# Patient Record
Sex: Female | Born: 1960 | Race: White | Hispanic: No | Marital: Married | Smoking: Never smoker
Health system: Southern US, Community
[De-identification: ages and names within clinical notes are randomized; demographics above are authoritative.]

## PROBLEM LIST (undated history)

## (undated) DIAGNOSIS — M1990 Unspecified osteoarthritis, unspecified site: Secondary | ICD-10-CM

## (undated) DIAGNOSIS — E079 Disorder of thyroid, unspecified: Secondary | ICD-10-CM

## (undated) DIAGNOSIS — I10 Essential (primary) hypertension: Secondary | ICD-10-CM

## (undated) DIAGNOSIS — R51 Headache: Secondary | ICD-10-CM

## (undated) DIAGNOSIS — R519 Headache, unspecified: Secondary | ICD-10-CM

## (undated) DIAGNOSIS — Z460 Encounter for fitting and adjustment of spectacles and contact lenses: Secondary | ICD-10-CM

## (undated) DIAGNOSIS — N200 Calculus of kidney: Secondary | ICD-10-CM

## (undated) DIAGNOSIS — Z780 Asymptomatic menopausal state: Secondary | ICD-10-CM

## (undated) DIAGNOSIS — R0981 Nasal congestion: Secondary | ICD-10-CM

## (undated) DIAGNOSIS — L309 Dermatitis, unspecified: Secondary | ICD-10-CM

## (undated) DIAGNOSIS — K219 Gastro-esophageal reflux disease without esophagitis: Secondary | ICD-10-CM

## (undated) DIAGNOSIS — R200 Anesthesia of skin: Secondary | ICD-10-CM

## (undated) DIAGNOSIS — J45909 Unspecified asthma, uncomplicated: Secondary | ICD-10-CM

## (undated) DIAGNOSIS — Z803 Family history of malignant neoplasm of breast: Secondary | ICD-10-CM

## (undated) DIAGNOSIS — F419 Anxiety disorder, unspecified: Secondary | ICD-10-CM

## (undated) DIAGNOSIS — T7840XA Allergy, unspecified, initial encounter: Secondary | ICD-10-CM

## (undated) HISTORY — DX: Dermatitis, unspecified: L30.9

## (undated) HISTORY — DX: Gastro-esophageal reflux disease without esophagitis: K21.9

## (undated) HISTORY — DX: Nasal congestion: R09.81

## (undated) HISTORY — DX: Calculus of kidney: N20.0

## (undated) HISTORY — DX: Disorder of thyroid, unspecified: E07.9

## (undated) HISTORY — DX: Essential (primary) hypertension: I10

## (undated) HISTORY — DX: Headache: R51

## (undated) HISTORY — DX: Unspecified osteoarthritis, unspecified site: M19.90

## (undated) HISTORY — DX: Encounter for fitting and adjustment of spectacles and contact lenses: Z46.0

## (undated) HISTORY — DX: Allergy, unspecified, initial encounter: T78.40XA

## (undated) HISTORY — DX: Family history of malignant neoplasm of breast: Z80.3

## (undated) HISTORY — DX: Headache, unspecified: R51.9

## (undated) HISTORY — DX: Asymptomatic menopausal state: Z78.0

## (undated) HISTORY — PX: BREAST EXCISIONAL BIOPSY: SUR124

## (undated) HISTORY — DX: Anxiety disorder, unspecified: F41.9

## (undated) HISTORY — DX: Anesthesia of skin: R20.0

---

## 1997-04-10 HISTORY — PX: OTHER SURGICAL HISTORY: SHX169

## 1997-06-16 ENCOUNTER — Ambulatory Visit (HOSPITAL_COMMUNITY): Admission: RE | Admit: 1997-06-16 | Discharge: 1997-06-16 | Payer: Self-pay | Admitting: Obstetrics and Gynecology

## 1997-07-27 ENCOUNTER — Ambulatory Visit (HOSPITAL_BASED_OUTPATIENT_CLINIC_OR_DEPARTMENT_OTHER): Admission: RE | Admit: 1997-07-27 | Discharge: 1997-07-27 | Payer: Self-pay | Admitting: Surgery

## 1999-02-08 ENCOUNTER — Other Ambulatory Visit: Admission: RE | Admit: 1999-02-08 | Discharge: 1999-02-08 | Payer: Self-pay | Admitting: Obstetrics and Gynecology

## 1999-08-22 ENCOUNTER — Other Ambulatory Visit: Admission: RE | Admit: 1999-08-22 | Discharge: 1999-08-22 | Payer: Self-pay | Admitting: Obstetrics and Gynecology

## 2000-03-05 ENCOUNTER — Other Ambulatory Visit: Admission: RE | Admit: 2000-03-05 | Discharge: 2000-03-05 | Payer: Self-pay | Admitting: Obstetrics and Gynecology

## 2001-03-20 ENCOUNTER — Other Ambulatory Visit: Admission: RE | Admit: 2001-03-20 | Discharge: 2001-03-20 | Payer: Self-pay | Admitting: Obstetrics and Gynecology

## 2002-04-18 ENCOUNTER — Other Ambulatory Visit: Admission: RE | Admit: 2002-04-18 | Discharge: 2002-04-18 | Payer: Self-pay | Admitting: Obstetrics and Gynecology

## 2003-05-12 ENCOUNTER — Other Ambulatory Visit: Admission: RE | Admit: 2003-05-12 | Discharge: 2003-05-12 | Payer: Self-pay | Admitting: Obstetrics and Gynecology

## 2004-05-17 ENCOUNTER — Other Ambulatory Visit: Admission: RE | Admit: 2004-05-17 | Discharge: 2004-05-17 | Payer: Self-pay | Admitting: Obstetrics and Gynecology

## 2005-04-10 HISTORY — PX: APPENDECTOMY: SHX54

## 2005-06-19 ENCOUNTER — Other Ambulatory Visit: Admission: RE | Admit: 2005-06-19 | Discharge: 2005-06-19 | Payer: Self-pay | Admitting: Obstetrics and Gynecology

## 2005-08-23 ENCOUNTER — Encounter: Payer: Self-pay | Admitting: Obstetrics and Gynecology

## 2005-08-23 ENCOUNTER — Encounter (INDEPENDENT_AMBULATORY_CARE_PROVIDER_SITE_OTHER): Payer: Self-pay | Admitting: Specialist

## 2005-08-23 ENCOUNTER — Observation Stay (HOSPITAL_COMMUNITY): Admission: EM | Admit: 2005-08-23 | Discharge: 2005-08-25 | Payer: Self-pay | Admitting: Emergency Medicine

## 2006-06-19 ENCOUNTER — Ambulatory Visit (HOSPITAL_COMMUNITY): Admission: RE | Admit: 2006-06-19 | Discharge: 2006-06-19 | Payer: Self-pay | Admitting: Obstetrics and Gynecology

## 2008-09-17 ENCOUNTER — Emergency Department (HOSPITAL_BASED_OUTPATIENT_CLINIC_OR_DEPARTMENT_OTHER): Admission: EM | Admit: 2008-09-17 | Discharge: 2008-09-17 | Payer: Self-pay | Admitting: Emergency Medicine

## 2008-09-17 ENCOUNTER — Ambulatory Visit: Payer: Self-pay | Admitting: Diagnostic Radiology

## 2008-09-29 ENCOUNTER — Encounter: Admission: RE | Admit: 2008-09-29 | Discharge: 2008-09-29 | Payer: Self-pay | Admitting: Orthopedic Surgery

## 2008-10-02 ENCOUNTER — Ambulatory Visit: Payer: Self-pay | Admitting: Internal Medicine

## 2008-10-07 LAB — CBC WITH DIFFERENTIAL/PLATELET
BASO%: 0.6 % (ref 0.0–2.0)
EOS%: 0.8 % (ref 0.0–7.0)
HCT: 39.5 % (ref 34.8–46.6)
LYMPH%: 20.6 % (ref 14.0–49.7)
MCH: 32.3 pg (ref 25.1–34.0)
MCHC: 34.7 g/dL (ref 31.5–36.0)
MONO#: 0.3 10*3/uL (ref 0.1–0.9)
NEUT%: 68.2 % (ref 38.4–76.8)
Platelets: 326 10*3/uL (ref 145–400)

## 2008-10-08 LAB — COMPREHENSIVE METABOLIC PANEL
ALT: 16 U/L (ref 0–35)
CO2: 25 mEq/L (ref 19–32)
Creatinine, Ser: 0.72 mg/dL (ref 0.40–1.20)
Total Bilirubin: 0.5 mg/dL (ref 0.3–1.2)

## 2008-10-08 LAB — KAPPA/LAMBDA LIGHT CHAINS: Kappa free light chain: <0.6 mg/dL (ref 0.33–1.94)

## 2008-10-08 LAB — IGG, IGA, IGM
IgA: 133 mg/dL (ref 68–378)
IgG (Immunoglobin G), Serum: 845 mg/dL (ref 694–1618)
IgM, Serum: 139 mg/dL (ref 60–263)

## 2008-10-08 LAB — LACTATE DEHYDROGENASE: LDH: 216 U/L (ref 94–250)

## 2008-10-08 LAB — BETA 2 MICROGLOBULIN, SERUM: Beta-2 Microglobulin: 1.33 mg/L (ref 1.01–1.73)

## 2008-10-09 ENCOUNTER — Ambulatory Visit (HOSPITAL_COMMUNITY): Admission: RE | Admit: 2008-10-09 | Discharge: 2008-10-09 | Payer: Self-pay | Admitting: Internal Medicine

## 2008-10-09 ENCOUNTER — Encounter: Admission: RE | Admit: 2008-10-09 | Discharge: 2008-10-09 | Payer: Self-pay | Admitting: Internal Medicine

## 2008-10-13 ENCOUNTER — Encounter (HOSPITAL_COMMUNITY): Admission: RE | Admit: 2008-10-13 | Discharge: 2008-12-10 | Payer: Self-pay | Admitting: Orthopedic Surgery

## 2008-10-15 LAB — COMPREHENSIVE METABOLIC PANEL
ALT: 13 U/L (ref 0–35)
AST: 21 U/L (ref 0–37)
Alkaline Phosphatase: 59 U/L (ref 39–117)
Creatinine, Ser: 0.71 mg/dL (ref 0.40–1.20)
Total Bilirubin: 0.4 mg/dL (ref 0.3–1.2)

## 2008-10-15 LAB — CBC WITH DIFFERENTIAL/PLATELET
BASO%: 0.7 % (ref 0.0–2.0)
EOS%: 5.5 % (ref 0.0–7.0)
HCT: 37.8 % (ref 34.8–46.6)
LYMPH%: 29.8 % (ref 14.0–49.7)
MCH: 32.2 pg (ref 25.1–34.0)
MCHC: 34.2 g/dL (ref 31.5–36.0)
NEUT%: 51.5 % (ref 38.4–76.8)
Platelets: 328 10*3/uL (ref 145–400)

## 2009-03-24 ENCOUNTER — Encounter: Admission: RE | Admit: 2009-03-24 | Discharge: 2009-03-24 | Payer: Self-pay | Admitting: Surgery

## 2009-10-13 ENCOUNTER — Encounter: Admission: RE | Admit: 2009-10-13 | Discharge: 2009-10-13 | Payer: Self-pay | Admitting: Obstetrics and Gynecology

## 2009-10-26 ENCOUNTER — Encounter (INDEPENDENT_AMBULATORY_CARE_PROVIDER_SITE_OTHER): Payer: Self-pay | Admitting: *Deleted

## 2009-10-28 ENCOUNTER — Ambulatory Visit: Payer: Self-pay | Admitting: Gastroenterology

## 2009-12-09 HISTORY — PX: COLONOSCOPY: SHX174

## 2009-12-20 ENCOUNTER — Ambulatory Visit: Payer: Self-pay | Admitting: Gastroenterology

## 2010-04-28 ENCOUNTER — Emergency Department (HOSPITAL_BASED_OUTPATIENT_CLINIC_OR_DEPARTMENT_OTHER)
Admission: EM | Admit: 2010-04-28 | Discharge: 2010-04-28 | Payer: Self-pay | Source: Home / Self Care | Admitting: Emergency Medicine

## 2010-05-02 ENCOUNTER — Encounter: Payer: Self-pay | Admitting: Orthopedic Surgery

## 2010-05-02 LAB — URINE MICROSCOPIC-ADD ON

## 2010-05-02 LAB — URINALYSIS, ROUTINE W REFLEX MICROSCOPIC
Bilirubin Urine: NEGATIVE
Leukocytes, UA: NEGATIVE
Nitrite: NEGATIVE
Specific Gravity, Urine: 1.013 (ref 1.005–1.030)
pH: 8 (ref 5.0–8.0)

## 2010-05-02 LAB — CBC
MCH: 30.2 pg (ref 26.0–34.0)
MCHC: 33.3 g/dL (ref 30.0–36.0)
Platelets: 351 10*3/uL (ref 150–400)
RDW: 13.2 % (ref 11.5–15.5)

## 2010-05-02 LAB — COMPREHENSIVE METABOLIC PANEL
ALT: 29 U/L (ref 0–35)
AST: 40 U/L — ABNORMAL HIGH (ref 0–37)
Calcium: 10.2 mg/dL (ref 8.4–10.5)
Creatinine, Ser: 0.9 mg/dL (ref 0.4–1.2)
GFR calc Af Amer: >60 mL/min (ref >60)
Sodium: 145 mEq/L (ref 135–145)
Total Protein: 7.2 g/dL (ref 6.0–8.3)

## 2010-05-02 LAB — DIFFERENTIAL
Basophils Absolute: 0 10*3/uL (ref 0.0–0.1)
Basophils Relative: 0 % (ref 0–1)
Eosinophils Absolute: 0.2 10*3/uL (ref 0.0–0.7)
Eosinophils Relative: 3 % (ref 0–5)
Monocytes Absolute: 0.4 10*3/uL (ref 0.1–1.0)
Neutro Abs: 3.8 10*3/uL (ref 1.7–7.7)

## 2010-05-03 LAB — URINE CULTURE
Colony Count: 35000
Culture  Setup Time: 201201192205

## 2010-05-12 NOTE — Procedures (Signed)
Summary: Colonoscopy  Patient: Jennifer Evans Note: All result statuses are Final unless otherwise noted.  Tests: (1) Colonoscopy (COL)   COL Colonoscopy           DONE     [facility]     [address]     [place], [place]  [address]           COLONOSCOPY PROCEDURE REPORT           PATIENT:  Jennifer, Evans  MR#:  [id number]     BIRTHDATE:  25-Apr-1960, 49 yrs. old  GENDER:  female     ENDOSCOPIST:  Rachael Fee, MD     REF. BY:  Richardean Chimera, M.D.     PROCEDURE DATE:  12/20/2009     PROCEDURE:  Diagnostic Colonoscopy     ASA CLASS:  Class II     INDICATIONS:  Routine Risk Screening     MEDICATIONS:   Fentanyl 75 mcg IV, Versed 8 mg IV           DESCRIPTION OF PROCEDURE:   After the risks benefits and     alternatives of the procedure were thoroughly explained, informed     consent was obtained.  Digital rectal exam was performed and     revealed no rectal masses.   The LB CF-[id number] endoscope     was introduced through the anus and advanced to the cecum, which     was identified by both the appendix and ileocecal valve, without     limitations.  The quality of the prep was excellent, using     MoviPrep.  The instrument was then slowly withdrawn as the colon     was fully examined.     <<PROCEDUREIMAGES>>           FINDINGS:  A normal appearing cecum, ileocecal valve, and     appendiceal orifice were identified. The ascending, hepatic     flexure, transverse, splenic flexure, descending, sigmoid colon,     and rectum appeared unremarkable (see image1, image2, and image3).     Retroflexed views in the rectum revealed no abnormalities.    The     scope was then withdrawn from the patient and the procedure     completed.           COMPLICATIONS:  None           ENDOSCOPIC IMPRESSION:     1) Normal colon; no polyps or cancers           RECOMMENDATIONS:     1) Continue current colorectal screening recommendations for     "routine risk" patients with a  repeat colonoscopy in 10 years.           REPEAT EXAM:  10 years           ______________________________     Rachael Fee, MD           n.     eSIGNED:   Rachael Fee at 12/20/2009 11:04 AM           Dellia Nims, [id number]  Note: An exclamation mark (!) indicates a result that was not dispersed into the flowsheet. Document Creation Date: 12/20/2009 11:05 AM _______________________________________________________________________  (1) Order result status: Final Collection or observation date-time: 12/20/2009 11:02 Requested date-time:  Receipt date-time:  Reported date-time:  Referring Physician:   Ordering Physician: Rob Bunting [id number]) Specimen Source:  Source: Launa Grill Order Number: [id number] Lab  site:   Appended Document: Colonoscopy    Clinical Lists Changes  Observations: Added new observation of COLONNXTDUE: 12/2019 (12/20/2009 11:18)

## 2010-05-12 NOTE — Miscellaneous (Signed)
Summary: [facility] previsit  Clinical Lists Changes  Medications: Added new medication of MOVIPREP 100 GM  SOLR (PEG-KCL-NACL-NASULF-NA ASC-C) As per prep instructions. - Signed Rx of MOVIPREP 100 GM  SOLR (PEG-KCL-NACL-NASULF-NA ASC-C) As per prep instructions.;  #1 x 0;  Signed;  Entered by: Karl Bales RN;  Authorized by: Rachael Fee MD;  Method used: Electronically to [id number] [address] [place], [place]  [address], Ph: [phone] or [phone], Fax: [phone] Observations: Added new observation of NKA: T (10/28/2009 10:55)    Prescriptions: MOVIPREP 100 GM  SOLR (PEG-KCL-NACL-NASULF-NA ASC-C) As per prep instructions.  #1 x 0   Entered by:   Karl Bales RN   Authorized by:   Rachael Fee MD   Signed by:   Karl Bales RN on 10/28/2009   Method used:   Electronically to        [id number] (retail)       [address]       [place], [place]  [address]       Ph: [phone] or [phone]       Fax: [phone]   RxID:   [id number]

## 2010-05-12 NOTE — Letter (Signed)
Summary: [facility] Instructions  [facility]  [address] [place], [place] [address]   Phone: [phone]  Fax: [phone]       Jennifer Evans    08-17-1960    MRN: [id number]        Procedure Day /Date: Friday 11-12-09     Arrival Time: 1:00 p.m.     Procedure Time: 2:00 p.m.     Location of Procedure:                    _x _  [facility] (4th Floor)  PREPARATION FOR COLONOSCOPY WITH MOVIPREP   Starting 5 days prior to your procedure  11-08-09 do not eat nuts, seeds, popcorn, corn, beans, peas,  salads, or any raw vegetables.  Do not take any fiber supplements (e.g. Metamucil, Citrucel, and Benefiber).  THE DAY BEFORE YOUR PROCEDURE         DATE:  11-11-09   DAY:  Thursday  1.  Drink clear liquids the entire day-NO SOLID FOOD  2.  Do not drink anything colored red or purple.  Avoid juices with pulp.  No orange juice.  3.  Drink at least 64 oz. (8 glasses) of fluid/clear liquids during the day to prevent dehydration and help the prep work efficiently.  CLEAR LIQUIDS INCLUDE: Water Jello Ice Popsicles Tea (sugar ok, no milk/cream) Powdered fruit flavored drinks Coffee (sugar ok, no milk/cream) Gatorade Juice: apple, white grape, white cranberry  Lemonade Clear bullion, consomm, broth Carbonated beverages (any kind) Strained chicken noodle soup Hard Candy                             4.  In the morning, mix first dose of MoviPrep solution:    Empty 1 Pouch A and 1 Pouch B into the disposable container    Add lukewarm drinking water to the top line of the container. Mix to dissolve    Refrigerate (mixed solution should be used within 24 hrs)  5.  Begin drinking the prep at 5:00 p.m. The MoviPrep container is divided by 4 marks.   Every 15 minutes drink the solution down to the next mark (approximately 8 oz) until the full liter is complete.   6.  Follow completed prep with 16 oz of clear liquid of your choice (Nothing red or purple).   Continue to drink clear liquids until bedtime.  7.  Before going to bed, mix second dose of MoviPrep solution:    Empty 1 Pouch A and 1 Pouch B into the disposable container    Add lukewarm drinking water to the top line of the container. Mix to dissolve    Refrigerate  THE DAY OF YOUR PROCEDURE      DATE:  11-12-09  DAY:  Friday  Beginning at  9:00 a.m. (5 hours before procedure):         1. Every 15 minutes, drink the solution down to the next mark (approx 8 oz) until the full liter is complete.  2. Follow completed prep with 16 oz. of clear liquid of your choice.    3. You may drink clear liquids until  12:00 p.m. (2 HOURS BEFORE PROCEDURE).   MEDICATION INSTRUCTIONS  Unless otherwise instructed, you should take regular prescription medications with a small sip of water   as early as possible the morning of your procedure.           OTHER  INSTRUCTIONS  You will need a responsible adult at least 51 years of age to accompany you and drive you home.   This person must remain in the waiting room during your procedure.  Wear loose fitting clothing that is easily removed.  Leave jewelry and other valuables at home.  However, you may wish to bring a book to read or  an iPod/MP3 player to listen to music as you wait for your procedure to start.  Remove all body piercing jewelry and leave at home.  Total time from sign-in until discharge is approximately 2-3 hours.  You should go home directly after your procedure and rest.  You can resume normal activities the  day after your procedure.  The day of your procedure you should not:   Drive   Make legal decisions   Operate machinery   Drink alcohol   Return to work  You will receive specific instructions about eating, activities and medications before you leave.    The above instructions have been reviewed and explained to me by   Karl Bales RN  October 28, 2009 11:22 AM    I fully understand and can  verbalize these instructions _____________________________ Date _________

## 2010-07-12 ENCOUNTER — Other Ambulatory Visit (HOSPITAL_COMMUNITY): Payer: Self-pay | Admitting: Obstetrics and Gynecology

## 2010-07-12 DIAGNOSIS — Z09 Encounter for follow-up examination after completed treatment for conditions other than malignant neoplasm: Secondary | ICD-10-CM

## 2010-07-15 ENCOUNTER — Ambulatory Visit (HOSPITAL_COMMUNITY): Admission: RE | Admit: 2010-07-15 | Payer: 59 | Source: Ambulatory Visit

## 2010-07-18 LAB — URINALYSIS, ROUTINE W REFLEX MICROSCOPIC
Bilirubin Urine: NEGATIVE
Glucose, UA: NEGATIVE mg/dL
Ketones, ur: NEGATIVE mg/dL
Protein, ur: NEGATIVE mg/dL
pH: 7 (ref 5.0–8.0)

## 2010-07-18 LAB — BASIC METABOLIC PANEL
BUN: 12 mg/dL (ref 6–23)
CO2: 23 mEq/L (ref 19–32)
Calcium: 9.1 mg/dL (ref 8.4–10.5)
Chloride: 108 mEq/L (ref 96–112)
Creatinine, Ser: 0.7 mg/dL (ref 0.4–1.2)
GFR calc Af Amer: >60 mL/min (ref >60)

## 2010-07-18 LAB — PREGNANCY, URINE: Preg Test, Ur: NEGATIVE

## 2010-07-27 ENCOUNTER — Ambulatory Visit (HOSPITAL_COMMUNITY): Payer: 59

## 2010-07-29 ENCOUNTER — Ambulatory Visit (HOSPITAL_COMMUNITY): Payer: 59

## 2010-08-03 ENCOUNTER — Ambulatory Visit (HOSPITAL_COMMUNITY): Admission: RE | Admit: 2010-08-03 | Payer: 59 | Source: Ambulatory Visit

## 2010-08-16 ENCOUNTER — Other Ambulatory Visit: Payer: Self-pay | Admitting: Physician Assistant

## 2010-08-26 NOTE — H&P (Signed)
NAMEAKSHITHA, CULMER NO.:  [id number]   MEDICAL RECORD NO.:  [id number]          PATIENT TYPE:  INP   LOCATION:  0098                         FACILITY:  [facility]   PHYSICIAN:  Sandria Bales. Ezzard Standing, M.D.  DATE OF BIRTH:  26-Aug-1960   DATE OF ADMISSION:  08/23/2005  DATE OF DISCHARGE:                                HISTORY & PHYSICAL   HISTORY OF PRESENT ILLNESS:  This is a 50 year old white female who on  Sunday, Aug 20, 2005, had some vague abdominal pain in the center of her  abdomen.  She went to the urgent care that evening.  She was told she was  constipated and she went home.  By Monday, she had increasing abdominal pain  and  saw Dr. Myna Bright, who thought this may be some type of bladder  infection.  He ruled out an ovarian cyst.  She was placed on Cipro and  Darvocet.  She had nausea and vomiting Monday evening, May 14th.  A little  bit less vomiting on Tuesday, the 15th, but because of persistent pain and  symptoms which now localized into her right lower quadrant, talked to Dr.  Gaye Alken today, who ordered a CT scan.  The CT scan done at [facility]  suggestive of a focally perforated appendicitis in the right lower quadrant.   The patient has no prior GI history.  No history of peptic ulcer disease,  liver disease, colon disease.  She has had no prior abdominal surgery.  She  did say that about 12 years ago, she had some vague abdominal pain.  She was  given some Levsin, and those symptoms resolved.   PAST MEDICAL HISTORY:  She has no allergies.  Her only medication is Zyrtec,  she takes for allergies.  She has not been taking that for several weeks.   REVIEW OF SYSTEMS:  PULMONARY:  Does not smoke cigarettes.  No history of  pneumonia or tuberculosis.  CARDIAC:  No history of heart disease, chest pain, or hypertension.  GASTROINTESTINAL:  See history of present illness.  UROLOGIC:  No history of kidney stones or kidney infections.   PHYSICAL  EXAMINATION:  VITAL SIGNS:  Temperature 101.3, blood pressure  154/98, pulse 96, respirations 18.  GENERAL APPEARANCE:  Well-nourished, pleasant white female.  HEENT:  Unremarkable.  NECK:  Supple without mass or thyromegaly.  LUNGS:  Clear to auscultation with symmetric breath sounds.  HEART:  Regular rate and rhythm without murmur or rub.  ABDOMEN:  Surprisingly benign.  She has some mild-to-moderate tenderness in  her lower abdomen, maybe right worse than left, but she has no guarding.  No  rebound.  She has active bowel sounds.  She is not distended.  PELVIC:  I did do a pelvic exam, as this was done recently by Dr. Gaye Alken.  EXTREMITIES:  She has good strength in her upper and lower extremities.  NEUROLOGIC:  Grossly intact.   I reviewed her CT scan with Signa Kell, and it appears she has a focal  perforation of her appendix at the edge of her right pelvic brim  with  appendicolith and air/fluid levels.   IMPRESSION/PLAN:  1.  Locally ruptured appendicitis:  Discussed with the patient and her      husband about proceeding with surgery.  Discussed the indications and      potential complications of surgery.  Potential complications include but      are not limited to bleeding, infection.  I will start laparoscopically.      There is probably a 30-50% chance she would have to have open surgery,      and she understands this, and the length of hospitalization being 2-7      days, depending on how she does.  2.  Seasonal allergies.      Sandria Bales. Ezzard Standing, M.D.  Electronically Signed     DHN/MEDQ  D:  08/23/2005  T:  08/23/2005  Job:  [id number]   cc:   Juluis Mire, M.D.  Fax: [phone]

## 2010-08-26 NOTE — Op Note (Signed)
NAMEWALKER, PADDACK NO.:  [id number]   MEDICAL RECORD NO.:  [id number]          PATIENT TYPE:  INP   LOCATION:  0098                         FACILITY:  [facility]   PHYSICIAN:  Sandria Bales. Ezzard Standing, M.D.  DATE OF BIRTH:  [birth year]   DATE OF PROCEDURE:  08/23/2005  DATE OF DISCHARGE:                                 OPERATIVE REPORT   PREOPERATIVE DIAGNOSIS:  Ruptured appendix with abscess.   POSTOPERATIVE DIAGNOSIS:  Ruptured appendix with abscess.   OPERATION PERFORMED:  Laparoscopic appendectomy with evacuation of abscess.   SURGEON:  Sandria Bales. Ezzard Standing, M.D.   ASSISTANT:  None.   ANESTHESIA:  General endotracheal.   ESTIMATED BLOOD LOSS:  Minimal.   INDICATIONS FOR PROCEDURE:  Ms. Mohamed is a 50 year old white female who is  a patient of Dr. Richardean Chimera, who has had a four-day history of increasing  abdominal pain, CT scan showed what appears to be appendicitis of the right  pelvic brim with probable vocal abscess.  The patient now comes to the  operating room for attempted laparoscopic appendectomy.   Indications and potential complications explained to the patient.  The  potential complications include but not limited to bleeding, infection,  bowel injury, the possibility of open surgery.   DESCRIPTION OF PROCEDURE:  The patient was placed in a supine position.  Her  abdomen was prepped with Betadine solution and sterilely draped.  She was  already on Zosyn as an antibiotic.  She had a Foley catheter in place.  Her  left arm tucked, right arm out.   I prepped her abdomen with Betadine solution and sterilely draped and then  made an infraumbilical incision with sharp dissection carried down to the  abdominal cavity.  A 0 degree 10 mm laparoscope was inserted through a 12 mm  Hasson trocar.  The Hasson trocar was secured with a 0 Vicryl suture.  There  was a 5 mm trocar placed in the right upper quadrant, 10 mm trocar in the  left lower quadrant.   Abdominal exploration revealed right and left lobes of the liver  unremarkable, gallbladder unremarkable, stomach unremarkable, bowel that I  could see was unremarkable.   Along the right pelvic brim there was inflamed bowel which was stuck over an  abscess as the bowel was peeled off.  Got to the abscess cavity.  I did  obtain aerobic and anaerobic cultures.  I then irrigated the abscess out.  I  freed up the appendix which had ruptured about one third of the way from the  base.  I freed up the entire appendix using Harmonic scalpel to take down  the mesentery of appendix.  I did have the mesenteric artery bled and I  could not get it controlled with Harmonic scalpel and placed two EndoClips  across the appendiceal artery which controlled it.   I then removed the appendix using a 45 vascular load of the Endo-GIA stapler  across the base of the appendix.  I placed the appendix in the EndoCatch bag  and delivered it through the umbilicus and sent it to pathology.  I then  irrigated the abdomen with approximately 2 L of saline trying to get all  blood and abscess and fluid out.  I then reinspected the staple line which  looked good.  I reinspected the base of the appendix which did have  inflammatory exudate from the abscess and perforation but it was pretty well  irrigated out.  The remainder of the bowel was unremarkable.   I then removed the trocars in turn.  The umbilical trocar closed with a 0  Vicryl suture, the skin each trocar site was closed with a 5-0 Vicryl  suture, painted with tincture of tincture of benzoin and steri-stripped.   The patient tolerated the procedure well and was transported to recovery  room in good condition.  Sponge and needle counts were correct at the end of  this case.      Sandria Bales. Ezzard Standing, M.D.  Electronically Signed     DHN/MEDQ  D:  08/23/2005  T:  08/24/2005  Job:  [id number]   cc:   Juluis Mire, M.D.  Fax: [phone]

## 2010-10-19 ENCOUNTER — Other Ambulatory Visit: Payer: Self-pay | Admitting: Obstetrics and Gynecology

## 2010-10-19 DIAGNOSIS — Z1231 Encounter for screening mammogram for malignant neoplasm of breast: Secondary | ICD-10-CM

## 2010-11-16 ENCOUNTER — Ambulatory Visit
Admission: RE | Admit: 2010-11-16 | Discharge: 2010-11-16 | Disposition: A | Discharge status: Home / Self Care | Payer: 59 | Source: Ambulatory Visit | Attending: Obstetrics and Gynecology | Admitting: Obstetrics and Gynecology

## 2010-11-16 DIAGNOSIS — Z1231 Encounter for screening mammogram for malignant neoplasm of breast: Secondary | ICD-10-CM

## 2010-11-18 ENCOUNTER — Other Ambulatory Visit (HOSPITAL_COMMUNITY): Payer: Self-pay | Admitting: Obstetrics and Gynecology

## 2010-11-18 DIAGNOSIS — Z09 Encounter for follow-up examination after completed treatment for conditions other than malignant neoplasm: Secondary | ICD-10-CM

## 2010-11-22 ENCOUNTER — Ambulatory Visit (HOSPITAL_COMMUNITY)
Admission: RE | Admit: 2010-11-22 | Discharge: 2010-11-22 | Disposition: A | Discharge status: Home / Self Care | Payer: 59 | Source: Ambulatory Visit | Attending: Obstetrics and Gynecology | Admitting: Obstetrics and Gynecology

## 2010-11-22 DIAGNOSIS — R911 Solitary pulmonary nodule: Secondary | ICD-10-CM | POA: Insufficient documentation

## 2010-11-22 DIAGNOSIS — Z09 Encounter for follow-up examination after completed treatment for conditions other than malignant neoplasm: Secondary | ICD-10-CM

## 2010-11-22 MED ORDER — IOHEXOL 300 MG/ML  SOLN
85.0000 mL | Freq: Once | INTRAMUSCULAR | Status: AC | PRN
  Administered 2010-11-22: 85 mL via INTRAVENOUS

## 2011-02-21 ENCOUNTER — Other Ambulatory Visit (INDEPENDENT_AMBULATORY_CARE_PROVIDER_SITE_OTHER): Payer: Self-pay | Admitting: Surgery

## 2011-02-21 DIAGNOSIS — E041 Nontoxic single thyroid nodule: Secondary | ICD-10-CM

## 2011-02-22 ENCOUNTER — Encounter (INDEPENDENT_AMBULATORY_CARE_PROVIDER_SITE_OTHER): Payer: Self-pay | Admitting: Surgery

## 2011-02-22 ENCOUNTER — Ambulatory Visit
Admission: RE | Admit: 2011-02-22 | Discharge: 2011-02-22 | Disposition: A | Discharge status: Home / Self Care | Payer: 59 | Source: Ambulatory Visit | Attending: Surgery | Admitting: Surgery

## 2011-02-22 ENCOUNTER — Ambulatory Visit (INDEPENDENT_AMBULATORY_CARE_PROVIDER_SITE_OTHER): Payer: 59 | Admitting: Surgery

## 2011-02-22 VITALS — BP 172/80 | HR 78 | Temp 97.8°F | Resp 18 | Ht 64.0 in | Wt 122.6 lb

## 2011-02-22 DIAGNOSIS — E041 Nontoxic single thyroid nodule: Secondary | ICD-10-CM

## 2011-02-22 NOTE — Progress Notes (Signed)
Visit Diagnoses: 1. Thyroid nodule, uninodular, left lobe     HISTORY: Patient is a 50 year old white female previously followed in our practice for thyroid nodules. In 2010 her nodules had remained stable for 2-1/2 years and she was not on medication. She was therefore released back to the practice of her gynecologist. Recently she has noted a prominent nodule in the left thyroid lobe. Ultrasound examination performed this morning shows a normal sized thyroid gland with the right lobe measuring 3.9 cm and the left lobe measuring 3.3 cm. There is a 3 mm nodule in the right upper pole stable compared to her study of 2010. There is a 6 mm nodule in the left upper pole which is also stable. In the medial aspect of the left lobe there is a 12 x 9 x 9 mm complex nodule which has increased in size over the past 2 years. There are no particularly worrisome features identified.  Patient presents today for evaluation of thyroid nodules and further recommendations.   PERTINENT REVIEW OF SYSTEMS: The patient denies compressive symptoms. Denies tremors. Denies palpitations.   EXAM: HEENT: normocephalic; pupils equal and reactive; sclerae clear; dentition good; mucous membranes moist NECK:  Visual exam shows a motor marginal and the left thyroid lobe; palpation reveals a approximately 1-1.5 cm smooth nontender mobile nodule in the medial aspect of the left lobe. Remaining thyroid tissue is without dominant or discrete mass; asymmetric on extension; no palpable anterior or posterior cervical lymphadenopathy; no supraclavicular masses; no tenderness CHEST: clear to auscultation bilaterally without rales, rhonchi, or wheezes CARDIAC: regular rate and rhythm without significant murmur; peripheral pulses are full EXT:  non-tender without edema; no deformity NEURO: no gross focal deficits; no sign of tremor   IMPRESSION: Left thyroid nodule, 12 mm, interval increase in size   PLAN: The patient and her  husband and I reviewed all of the above information at length. I believe these are benign findings. However the enlarging nodule does bear close attention.  We will obtain a TSH level today.  Patient will undergo followup thyroid ultrasound in 6 months.  Patient will return for repeat physical examination in 6 months.   Velora Heckler, MD, FACS General & Endocrine Surgery [facility]

## 2011-03-15 ENCOUNTER — Encounter (INDEPENDENT_AMBULATORY_CARE_PROVIDER_SITE_OTHER): Payer: 59 | Admitting: Surgery

## 2011-08-22 ENCOUNTER — Ambulatory Visit
Admission: RE | Admit: 2011-08-22 | Discharge: 2011-08-22 | Disposition: A | Discharge status: Home / Self Care | Payer: 59 | Source: Ambulatory Visit | Attending: Surgery | Admitting: Surgery

## 2011-08-22 DIAGNOSIS — E041 Nontoxic single thyroid nodule: Secondary | ICD-10-CM

## 2011-12-20 ENCOUNTER — Other Ambulatory Visit: Payer: Self-pay | Admitting: Obstetrics and Gynecology

## 2011-12-20 DIAGNOSIS — Z1231 Encounter for screening mammogram for malignant neoplasm of breast: Secondary | ICD-10-CM

## 2012-01-19 ENCOUNTER — Ambulatory Visit
Admission: RE | Admit: 2012-01-19 | Discharge: 2012-01-19 | Disposition: A | Discharge status: Home / Self Care | Payer: 59 | Source: Ambulatory Visit | Attending: Obstetrics and Gynecology | Admitting: Obstetrics and Gynecology

## 2012-01-19 DIAGNOSIS — Z1231 Encounter for screening mammogram for malignant neoplasm of breast: Secondary | ICD-10-CM

## 2013-01-09 ENCOUNTER — Other Ambulatory Visit: Payer: Self-pay

## 2013-01-09 DIAGNOSIS — Z1231 Encounter for screening mammogram for malignant neoplasm of breast: Secondary | ICD-10-CM

## 2013-01-16 ENCOUNTER — Ambulatory Visit: Payer: 59

## 2013-04-16 ENCOUNTER — Ambulatory Visit
Admission: RE | Admit: 2013-04-16 | Discharge: 2013-04-16 | Disposition: A | Discharge status: Home / Self Care | Payer: BC Managed Care – PPO | Source: Ambulatory Visit

## 2013-04-16 DIAGNOSIS — Z1231 Encounter for screening mammogram for malignant neoplasm of breast: Secondary | ICD-10-CM

## 2013-09-04 ENCOUNTER — Other Ambulatory Visit (HOSPITAL_COMMUNITY): Payer: Self-pay | Admitting: Obstetrics and Gynecology

## 2013-09-04 DIAGNOSIS — E041 Nontoxic single thyroid nodule: Secondary | ICD-10-CM

## 2013-09-09 ENCOUNTER — Ambulatory Visit (HOSPITAL_COMMUNITY): Payer: BC Managed Care – PPO

## 2013-12-22 ENCOUNTER — Other Ambulatory Visit (HOSPITAL_COMMUNITY): Payer: Self-pay | Admitting: Family Medicine

## 2013-12-22 DIAGNOSIS — R1084 Generalized abdominal pain: Secondary | ICD-10-CM

## 2013-12-29 ENCOUNTER — Ambulatory Visit (HOSPITAL_COMMUNITY): Payer: BC Managed Care – PPO

## 2014-01-06 ENCOUNTER — Ambulatory Visit (HOSPITAL_COMMUNITY)
Admission: RE | Admit: 2014-01-06 | Discharge: 2014-01-06 | Disposition: A | Discharge status: Home / Self Care | Payer: BC Managed Care – PPO | Source: Ambulatory Visit | Attending: Diagnostic Radiology | Admitting: Diagnostic Radiology

## 2014-01-06 ENCOUNTER — Encounter (INDEPENDENT_AMBULATORY_CARE_PROVIDER_SITE_OTHER): Payer: Self-pay

## 2014-01-06 DIAGNOSIS — R1084 Generalized abdominal pain: Secondary | ICD-10-CM

## 2014-01-06 DIAGNOSIS — K829 Disease of gallbladder, unspecified: Secondary | ICD-10-CM | POA: Insufficient documentation

## 2014-01-06 MED ORDER — SINCALIDE 5 MCG IJ SOLR
INTRAMUSCULAR | Status: AC
  Administered 2014-01-06: 2.84 ug
  Filled 2014-01-06: qty 5

## 2014-01-06 MED ORDER — TECHNETIUM TC 99M MEBROFENIN IV KIT
5.0000 | PACK | Freq: Once | INTRAVENOUS | Status: AC | PRN
  Administered 2014-01-06: 5 via INTRAVENOUS

## 2014-01-06 MED ORDER — SINCALIDE 5 MCG IJ SOLR: 0.0200 ug/kg | Freq: Once | INTRAMUSCULAR | Status: DC

## 2014-01-06 MED ORDER — STERILE WATER FOR INJECTION IJ SOLN
INTRAMUSCULAR | Status: AC
  Administered 2014-01-06: 5 mL
  Filled 2014-01-06: qty 10

## 2014-02-04 ENCOUNTER — Other Ambulatory Visit (INDEPENDENT_AMBULATORY_CARE_PROVIDER_SITE_OTHER): Payer: Self-pay

## 2014-02-04 DIAGNOSIS — E042 Nontoxic multinodular goiter: Secondary | ICD-10-CM

## 2014-02-13 ENCOUNTER — Ambulatory Visit
Admission: RE | Admit: 2014-02-13 | Discharge: 2014-02-13 | Disposition: A | Discharge status: Home / Self Care | Payer: BC Managed Care – PPO | Source: Ambulatory Visit | Attending: Surgery | Admitting: Surgery

## 2014-02-13 ENCOUNTER — Encounter (INDEPENDENT_AMBULATORY_CARE_PROVIDER_SITE_OTHER): Payer: Self-pay

## 2014-02-13 DIAGNOSIS — E042 Nontoxic multinodular goiter: Secondary | ICD-10-CM

## 2014-06-25 ENCOUNTER — Other Ambulatory Visit: Payer: Self-pay

## 2014-06-25 DIAGNOSIS — Z1231 Encounter for screening mammogram for malignant neoplasm of breast: Secondary | ICD-10-CM

## 2014-07-15 ENCOUNTER — Ambulatory Visit
Admission: RE | Admit: 2014-07-15 | Discharge: 2014-07-15 | Disposition: A | Discharge status: Home / Self Care | Payer: BLUE CROSS/BLUE SHIELD | Source: Ambulatory Visit

## 2014-07-15 DIAGNOSIS — Z1231 Encounter for screening mammogram for malignant neoplasm of breast: Secondary | ICD-10-CM

## 2014-09-29 ENCOUNTER — Other Ambulatory Visit: Payer: Self-pay | Admitting: Obstetrics and Gynecology

## 2014-10-01 LAB — CYTOLOGY - PAP

## 2014-11-04 ENCOUNTER — Encounter: Payer: Self-pay | Admitting: Gastroenterology

## 2015-11-24 DIAGNOSIS — Z6822 Body mass index (BMI) 22.0-22.9, adult: Secondary | ICD-10-CM | POA: Diagnosis not present

## 2015-11-24 DIAGNOSIS — Z01419 Encounter for gynecological examination (general) (routine) without abnormal findings: Secondary | ICD-10-CM | POA: Diagnosis not present

## 2015-11-25 ENCOUNTER — Other Ambulatory Visit: Payer: Self-pay | Admitting: Obstetrics and Gynecology

## 2015-11-25 DIAGNOSIS — E041 Nontoxic single thyroid nodule: Secondary | ICD-10-CM

## 2015-12-01 ENCOUNTER — Ambulatory Visit
Admission: RE | Admit: 2015-12-01 | Discharge: 2015-12-01 | Disposition: A | Discharge status: Home / Self Care | Payer: BLUE CROSS/BLUE SHIELD | Source: Ambulatory Visit | Attending: Obstetrics and Gynecology | Admitting: Obstetrics and Gynecology

## 2015-12-01 DIAGNOSIS — E042 Nontoxic multinodular goiter: Secondary | ICD-10-CM | POA: Diagnosis not present

## 2015-12-01 DIAGNOSIS — E041 Nontoxic single thyroid nodule: Secondary | ICD-10-CM

## 2015-12-08 DIAGNOSIS — M9901 Segmental and somatic dysfunction of cervical region: Secondary | ICD-10-CM | POA: Diagnosis not present

## 2015-12-08 DIAGNOSIS — M50322 Other cervical disc degeneration at C5-C6 level: Secondary | ICD-10-CM | POA: Diagnosis not present

## 2015-12-08 DIAGNOSIS — Q72812 Congenital shortening of left lower limb: Secondary | ICD-10-CM | POA: Diagnosis not present

## 2015-12-08 DIAGNOSIS — M9905 Segmental and somatic dysfunction of pelvic region: Secondary | ICD-10-CM | POA: Diagnosis not present

## 2015-12-09 DIAGNOSIS — Q72812 Congenital shortening of left lower limb: Secondary | ICD-10-CM | POA: Diagnosis not present

## 2015-12-09 DIAGNOSIS — M9901 Segmental and somatic dysfunction of cervical region: Secondary | ICD-10-CM | POA: Diagnosis not present

## 2015-12-09 DIAGNOSIS — M9905 Segmental and somatic dysfunction of pelvic region: Secondary | ICD-10-CM | POA: Diagnosis not present

## 2015-12-09 DIAGNOSIS — M50322 Other cervical disc degeneration at C5-C6 level: Secondary | ICD-10-CM | POA: Diagnosis not present

## 2015-12-14 DIAGNOSIS — Q72812 Congenital shortening of left lower limb: Secondary | ICD-10-CM | POA: Diagnosis not present

## 2015-12-14 DIAGNOSIS — M9901 Segmental and somatic dysfunction of cervical region: Secondary | ICD-10-CM | POA: Diagnosis not present

## 2015-12-14 DIAGNOSIS — M50322 Other cervical disc degeneration at C5-C6 level: Secondary | ICD-10-CM | POA: Diagnosis not present

## 2015-12-14 DIAGNOSIS — M9905 Segmental and somatic dysfunction of pelvic region: Secondary | ICD-10-CM | POA: Diagnosis not present

## 2015-12-22 DIAGNOSIS — M9901 Segmental and somatic dysfunction of cervical region: Secondary | ICD-10-CM | POA: Diagnosis not present

## 2015-12-22 DIAGNOSIS — M25511 Pain in right shoulder: Secondary | ICD-10-CM | POA: Diagnosis not present

## 2015-12-23 DIAGNOSIS — M25511 Pain in right shoulder: Secondary | ICD-10-CM | POA: Diagnosis not present

## 2015-12-23 DIAGNOSIS — M9901 Segmental and somatic dysfunction of cervical region: Secondary | ICD-10-CM | POA: Diagnosis not present

## 2015-12-27 DIAGNOSIS — M9901 Segmental and somatic dysfunction of cervical region: Secondary | ICD-10-CM | POA: Diagnosis not present

## 2015-12-27 DIAGNOSIS — M25511 Pain in right shoulder: Secondary | ICD-10-CM | POA: Diagnosis not present

## 2015-12-28 DIAGNOSIS — M9901 Segmental and somatic dysfunction of cervical region: Secondary | ICD-10-CM | POA: Diagnosis not present

## 2015-12-28 DIAGNOSIS — M25511 Pain in right shoulder: Secondary | ICD-10-CM | POA: Diagnosis not present

## 2015-12-30 DIAGNOSIS — M9901 Segmental and somatic dysfunction of cervical region: Secondary | ICD-10-CM | POA: Diagnosis not present

## 2015-12-30 DIAGNOSIS — M25511 Pain in right shoulder: Secondary | ICD-10-CM | POA: Diagnosis not present

## 2016-01-03 DIAGNOSIS — M25511 Pain in right shoulder: Secondary | ICD-10-CM | POA: Diagnosis not present

## 2016-01-03 DIAGNOSIS — M9901 Segmental and somatic dysfunction of cervical region: Secondary | ICD-10-CM | POA: Diagnosis not present

## 2016-01-05 DIAGNOSIS — M25511 Pain in right shoulder: Secondary | ICD-10-CM | POA: Diagnosis not present

## 2016-01-05 DIAGNOSIS — M9901 Segmental and somatic dysfunction of cervical region: Secondary | ICD-10-CM | POA: Diagnosis not present

## 2016-01-06 DIAGNOSIS — M9901 Segmental and somatic dysfunction of cervical region: Secondary | ICD-10-CM | POA: Diagnosis not present

## 2016-01-06 DIAGNOSIS — M25511 Pain in right shoulder: Secondary | ICD-10-CM | POA: Diagnosis not present

## 2016-01-10 DIAGNOSIS — M25511 Pain in right shoulder: Secondary | ICD-10-CM | POA: Diagnosis not present

## 2016-01-10 DIAGNOSIS — M47816 Spondylosis without myelopathy or radiculopathy, lumbar region: Secondary | ICD-10-CM | POA: Diagnosis not present

## 2016-01-10 DIAGNOSIS — M47812 Spondylosis without myelopathy or radiculopathy, cervical region: Secondary | ICD-10-CM | POA: Diagnosis not present

## 2016-01-10 DIAGNOSIS — M9901 Segmental and somatic dysfunction of cervical region: Secondary | ICD-10-CM | POA: Diagnosis not present

## 2016-01-12 DIAGNOSIS — M9901 Segmental and somatic dysfunction of cervical region: Secondary | ICD-10-CM | POA: Diagnosis not present

## 2016-01-12 DIAGNOSIS — M25511 Pain in right shoulder: Secondary | ICD-10-CM | POA: Diagnosis not present

## 2016-01-13 ENCOUNTER — Other Ambulatory Visit: Payer: Self-pay | Admitting: Obstetrics and Gynecology

## 2016-01-13 DIAGNOSIS — Z1231 Encounter for screening mammogram for malignant neoplasm of breast: Secondary | ICD-10-CM

## 2016-01-13 DIAGNOSIS — M9901 Segmental and somatic dysfunction of cervical region: Secondary | ICD-10-CM | POA: Diagnosis not present

## 2016-01-13 DIAGNOSIS — M25511 Pain in right shoulder: Secondary | ICD-10-CM | POA: Diagnosis not present

## 2016-01-20 DIAGNOSIS — M25511 Pain in right shoulder: Secondary | ICD-10-CM | POA: Diagnosis not present

## 2016-01-20 DIAGNOSIS — M9901 Segmental and somatic dysfunction of cervical region: Secondary | ICD-10-CM | POA: Diagnosis not present

## 2016-01-24 DIAGNOSIS — M9901 Segmental and somatic dysfunction of cervical region: Secondary | ICD-10-CM | POA: Diagnosis not present

## 2016-01-24 DIAGNOSIS — M25511 Pain in right shoulder: Secondary | ICD-10-CM | POA: Diagnosis not present

## 2016-01-25 DIAGNOSIS — M25511 Pain in right shoulder: Secondary | ICD-10-CM | POA: Diagnosis not present

## 2016-01-25 DIAGNOSIS — M9901 Segmental and somatic dysfunction of cervical region: Secondary | ICD-10-CM | POA: Diagnosis not present

## 2016-01-28 ENCOUNTER — Ambulatory Visit
Admission: RE | Admit: 2016-01-28 | Discharge: 2016-01-28 | Disposition: A | Discharge status: Home / Self Care | Payer: BLUE CROSS/BLUE SHIELD | Source: Ambulatory Visit | Attending: Obstetrics and Gynecology | Admitting: Obstetrics and Gynecology

## 2016-01-28 DIAGNOSIS — Z1231 Encounter for screening mammogram for malignant neoplasm of breast: Secondary | ICD-10-CM | POA: Diagnosis not present

## 2016-03-17 DIAGNOSIS — H01115 Allergic dermatitis of left lower eyelid: Secondary | ICD-10-CM | POA: Diagnosis not present

## 2016-04-18 DIAGNOSIS — E042 Nontoxic multinodular goiter: Secondary | ICD-10-CM | POA: Diagnosis not present

## 2016-04-19 DIAGNOSIS — K219 Gastro-esophageal reflux disease without esophagitis: Secondary | ICD-10-CM | POA: Diagnosis not present

## 2016-04-19 DIAGNOSIS — M8580 Other specified disorders of bone density and structure, unspecified site: Secondary | ICD-10-CM | POA: Diagnosis not present

## 2016-04-19 DIAGNOSIS — I10 Essential (primary) hypertension: Secondary | ICD-10-CM | POA: Diagnosis not present

## 2016-05-04 DIAGNOSIS — M25511 Pain in right shoulder: Secondary | ICD-10-CM | POA: Diagnosis not present

## 2016-05-04 DIAGNOSIS — M9901 Segmental and somatic dysfunction of cervical region: Secondary | ICD-10-CM | POA: Diagnosis not present

## 2016-05-08 DIAGNOSIS — M25511 Pain in right shoulder: Secondary | ICD-10-CM | POA: Diagnosis not present

## 2016-05-08 DIAGNOSIS — M9901 Segmental and somatic dysfunction of cervical region: Secondary | ICD-10-CM | POA: Diagnosis not present

## 2016-05-24 DIAGNOSIS — M25511 Pain in right shoulder: Secondary | ICD-10-CM | POA: Diagnosis not present

## 2016-05-24 DIAGNOSIS — M9901 Segmental and somatic dysfunction of cervical region: Secondary | ICD-10-CM | POA: Diagnosis not present

## 2016-06-06 DIAGNOSIS — M25511 Pain in right shoulder: Secondary | ICD-10-CM | POA: Diagnosis not present

## 2016-06-06 DIAGNOSIS — M9901 Segmental and somatic dysfunction of cervical region: Secondary | ICD-10-CM | POA: Diagnosis not present

## 2016-06-27 DIAGNOSIS — M25511 Pain in right shoulder: Secondary | ICD-10-CM | POA: Diagnosis not present

## 2016-06-27 DIAGNOSIS — M9901 Segmental and somatic dysfunction of cervical region: Secondary | ICD-10-CM | POA: Diagnosis not present

## 2016-07-17 DIAGNOSIS — M9901 Segmental and somatic dysfunction of cervical region: Secondary | ICD-10-CM | POA: Diagnosis not present

## 2016-07-17 DIAGNOSIS — M25511 Pain in right shoulder: Secondary | ICD-10-CM | POA: Diagnosis not present

## 2016-07-19 DIAGNOSIS — M9901 Segmental and somatic dysfunction of cervical region: Secondary | ICD-10-CM | POA: Diagnosis not present

## 2016-07-19 DIAGNOSIS — M25511 Pain in right shoulder: Secondary | ICD-10-CM | POA: Diagnosis not present

## 2016-08-01 DIAGNOSIS — M9901 Segmental and somatic dysfunction of cervical region: Secondary | ICD-10-CM | POA: Diagnosis not present

## 2016-08-01 DIAGNOSIS — M25511 Pain in right shoulder: Secondary | ICD-10-CM | POA: Diagnosis not present

## 2016-08-03 DIAGNOSIS — M25511 Pain in right shoulder: Secondary | ICD-10-CM | POA: Diagnosis not present

## 2016-08-03 DIAGNOSIS — M9901 Segmental and somatic dysfunction of cervical region: Secondary | ICD-10-CM | POA: Diagnosis not present

## 2016-08-17 DIAGNOSIS — M9901 Segmental and somatic dysfunction of cervical region: Secondary | ICD-10-CM | POA: Diagnosis not present

## 2016-08-17 DIAGNOSIS — M25511 Pain in right shoulder: Secondary | ICD-10-CM | POA: Diagnosis not present

## 2016-08-30 DIAGNOSIS — M25511 Pain in right shoulder: Secondary | ICD-10-CM | POA: Diagnosis not present

## 2016-08-30 DIAGNOSIS — M9901 Segmental and somatic dysfunction of cervical region: Secondary | ICD-10-CM | POA: Diagnosis not present

## 2016-09-18 DIAGNOSIS — M25511 Pain in right shoulder: Secondary | ICD-10-CM | POA: Diagnosis not present

## 2016-09-18 DIAGNOSIS — M9901 Segmental and somatic dysfunction of cervical region: Secondary | ICD-10-CM | POA: Diagnosis not present

## 2016-10-24 DIAGNOSIS — M25511 Pain in right shoulder: Secondary | ICD-10-CM | POA: Diagnosis not present

## 2016-10-24 DIAGNOSIS — M9901 Segmental and somatic dysfunction of cervical region: Secondary | ICD-10-CM | POA: Diagnosis not present

## 2016-10-26 DIAGNOSIS — M9901 Segmental and somatic dysfunction of cervical region: Secondary | ICD-10-CM | POA: Diagnosis not present

## 2016-10-26 DIAGNOSIS — M25511 Pain in right shoulder: Secondary | ICD-10-CM | POA: Diagnosis not present

## 2016-11-06 DIAGNOSIS — M25511 Pain in right shoulder: Secondary | ICD-10-CM | POA: Diagnosis not present

## 2016-11-06 DIAGNOSIS — M9901 Segmental and somatic dysfunction of cervical region: Secondary | ICD-10-CM | POA: Diagnosis not present

## 2016-11-22 DIAGNOSIS — M9901 Segmental and somatic dysfunction of cervical region: Secondary | ICD-10-CM | POA: Diagnosis not present

## 2016-11-22 DIAGNOSIS — M25511 Pain in right shoulder: Secondary | ICD-10-CM | POA: Diagnosis not present

## 2016-11-28 DIAGNOSIS — Z01419 Encounter for gynecological examination (general) (routine) without abnormal findings: Secondary | ICD-10-CM | POA: Diagnosis not present

## 2016-11-28 DIAGNOSIS — Z6822 Body mass index (BMI) 22.0-22.9, adult: Secondary | ICD-10-CM | POA: Diagnosis not present

## 2016-12-07 DIAGNOSIS — M25511 Pain in right shoulder: Secondary | ICD-10-CM | POA: Diagnosis not present

## 2016-12-07 DIAGNOSIS — M9901 Segmental and somatic dysfunction of cervical region: Secondary | ICD-10-CM | POA: Diagnosis not present

## 2016-12-22 ENCOUNTER — Other Ambulatory Visit: Payer: Self-pay | Admitting: Obstetrics and Gynecology

## 2016-12-22 DIAGNOSIS — Z1231 Encounter for screening mammogram for malignant neoplasm of breast: Secondary | ICD-10-CM

## 2016-12-27 DIAGNOSIS — M25511 Pain in right shoulder: Secondary | ICD-10-CM | POA: Diagnosis not present

## 2016-12-27 DIAGNOSIS — M9901 Segmental and somatic dysfunction of cervical region: Secondary | ICD-10-CM | POA: Diagnosis not present

## 2017-01-09 DIAGNOSIS — M9901 Segmental and somatic dysfunction of cervical region: Secondary | ICD-10-CM | POA: Diagnosis not present

## 2017-01-09 DIAGNOSIS — M25511 Pain in right shoulder: Secondary | ICD-10-CM | POA: Diagnosis not present

## 2017-01-11 DIAGNOSIS — M25511 Pain in right shoulder: Secondary | ICD-10-CM | POA: Diagnosis not present

## 2017-01-11 DIAGNOSIS — M9901 Segmental and somatic dysfunction of cervical region: Secondary | ICD-10-CM | POA: Diagnosis not present

## 2017-01-18 DIAGNOSIS — Z23 Encounter for immunization: Secondary | ICD-10-CM | POA: Diagnosis not present

## 2017-02-07 DIAGNOSIS — J209 Acute bronchitis, unspecified: Secondary | ICD-10-CM | POA: Diagnosis not present

## 2017-02-13 ENCOUNTER — Inpatient Hospital Stay: Admission: RE | Admit: 2017-02-13 | Payer: BLUE CROSS/BLUE SHIELD | Source: Ambulatory Visit

## 2017-02-15 DIAGNOSIS — R05 Cough: Secondary | ICD-10-CM | POA: Diagnosis not present

## 2017-02-15 DIAGNOSIS — M25511 Pain in right shoulder: Secondary | ICD-10-CM | POA: Diagnosis not present

## 2017-02-15 DIAGNOSIS — M9901 Segmental and somatic dysfunction of cervical region: Secondary | ICD-10-CM | POA: Diagnosis not present

## 2017-03-15 ENCOUNTER — Ambulatory Visit
Admission: RE | Admit: 2017-03-15 | Discharge: 2017-03-15 | Disposition: A | Discharge status: Home / Self Care | Payer: BLUE CROSS/BLUE SHIELD | Source: Ambulatory Visit | Attending: Obstetrics and Gynecology | Admitting: Obstetrics and Gynecology

## 2017-03-15 DIAGNOSIS — Z1231 Encounter for screening mammogram for malignant neoplasm of breast: Secondary | ICD-10-CM

## 2017-05-16 DIAGNOSIS — M25511 Pain in right shoulder: Secondary | ICD-10-CM | POA: Diagnosis not present

## 2017-05-16 DIAGNOSIS — M9901 Segmental and somatic dysfunction of cervical region: Secondary | ICD-10-CM | POA: Diagnosis not present

## 2017-06-06 DIAGNOSIS — M25511 Pain in right shoulder: Secondary | ICD-10-CM | POA: Diagnosis not present

## 2017-06-06 DIAGNOSIS — M9901 Segmental and somatic dysfunction of cervical region: Secondary | ICD-10-CM | POA: Diagnosis not present

## 2017-06-06 IMAGING — MG 2D DIGITAL SCREENING BILATERAL MAMMOGRAM WITH CAD AND ADJUNCT TO
8 of 12 series · 8 of 28 positions shown · non-contrast
Comparison: Previous exam(s).

CLINICAL DATA: Screening.

EXAM:
2D DIGITAL SCREENING BILATERAL MAMMOGRAM WITH CAD AND ADJUNCT TOMO

[R MLO]
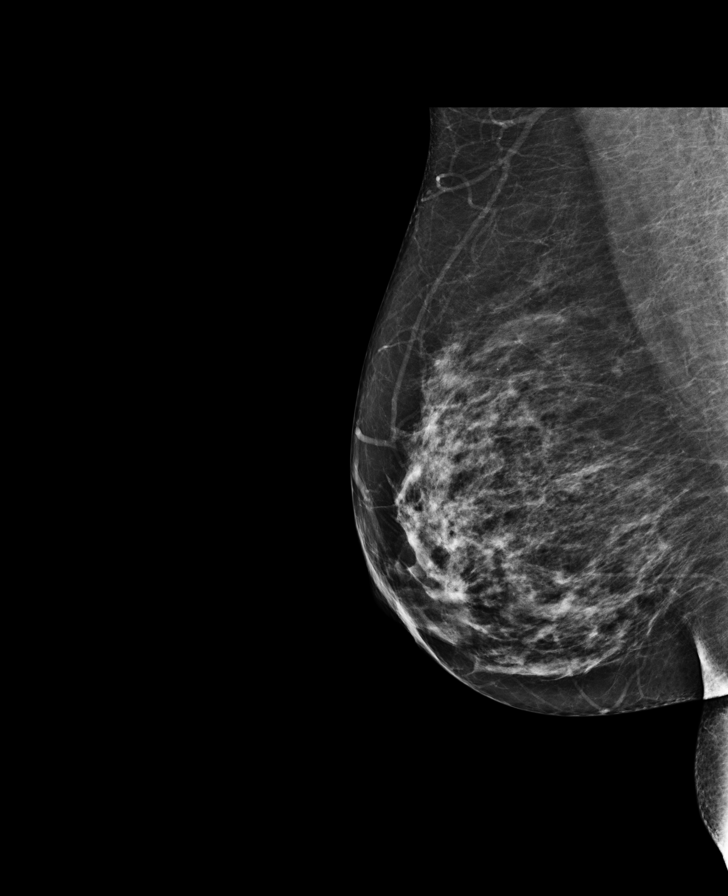

[R MLO synth-2D]
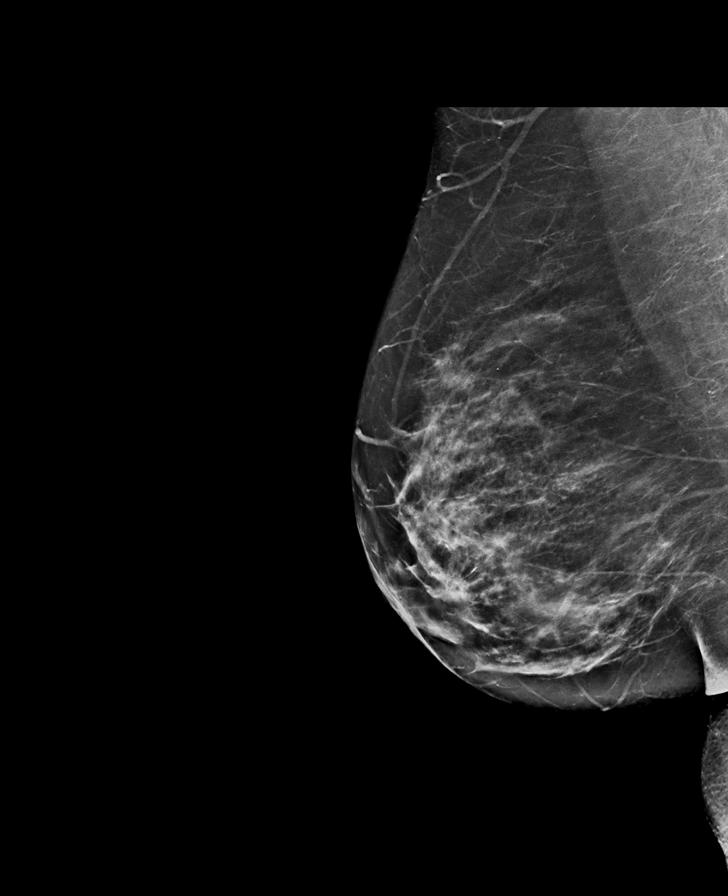

[L MLO]
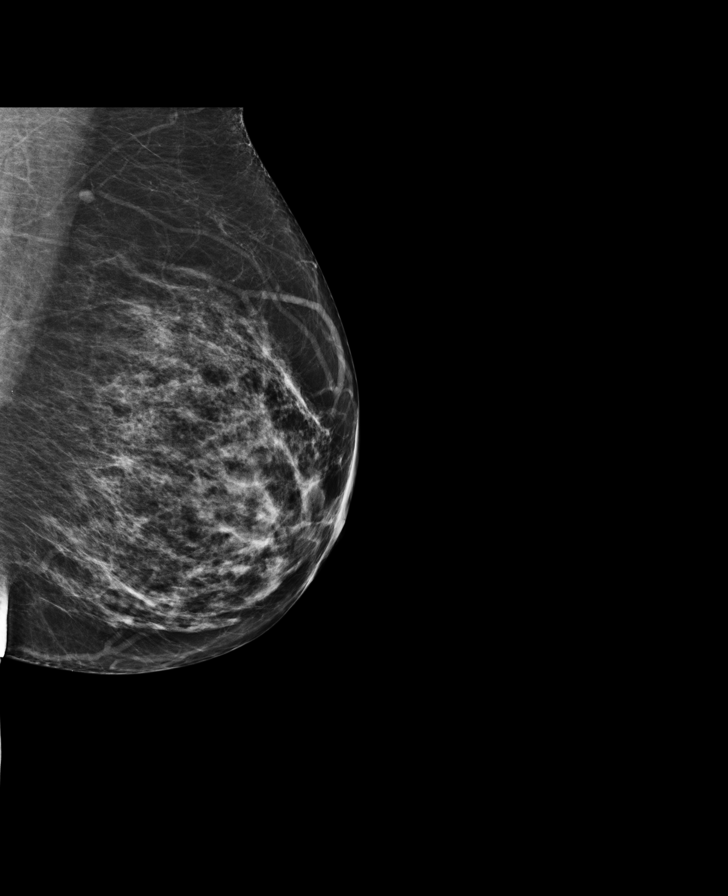

[R CC]
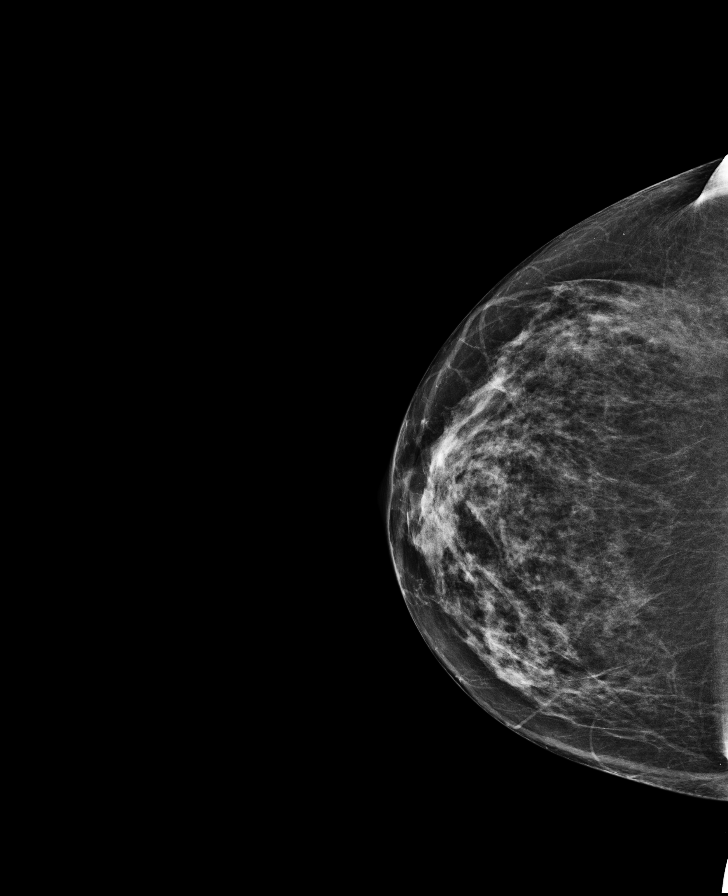

[R CC synth-2D]
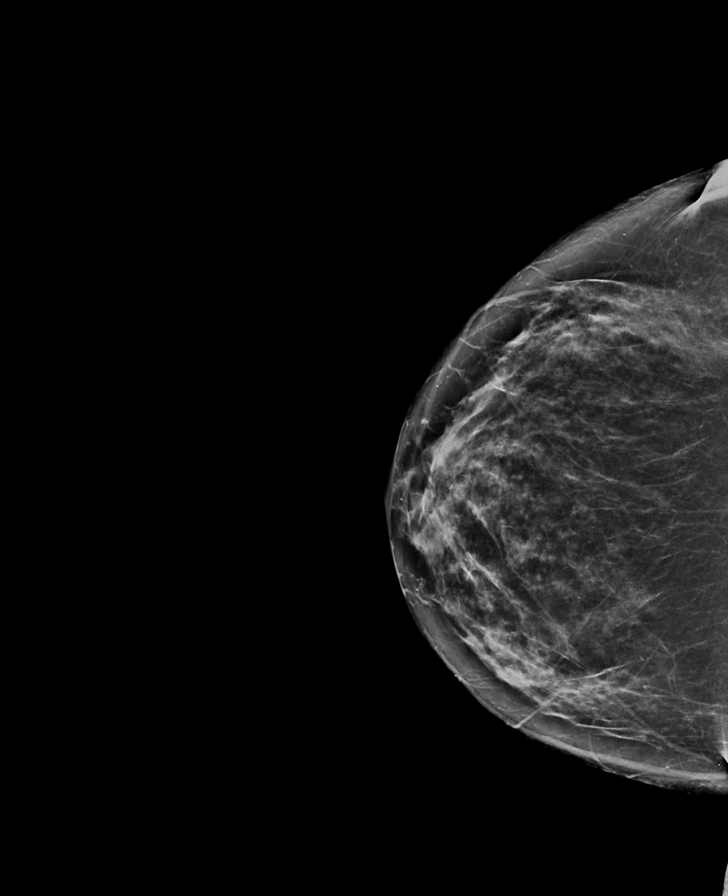

[L CC]
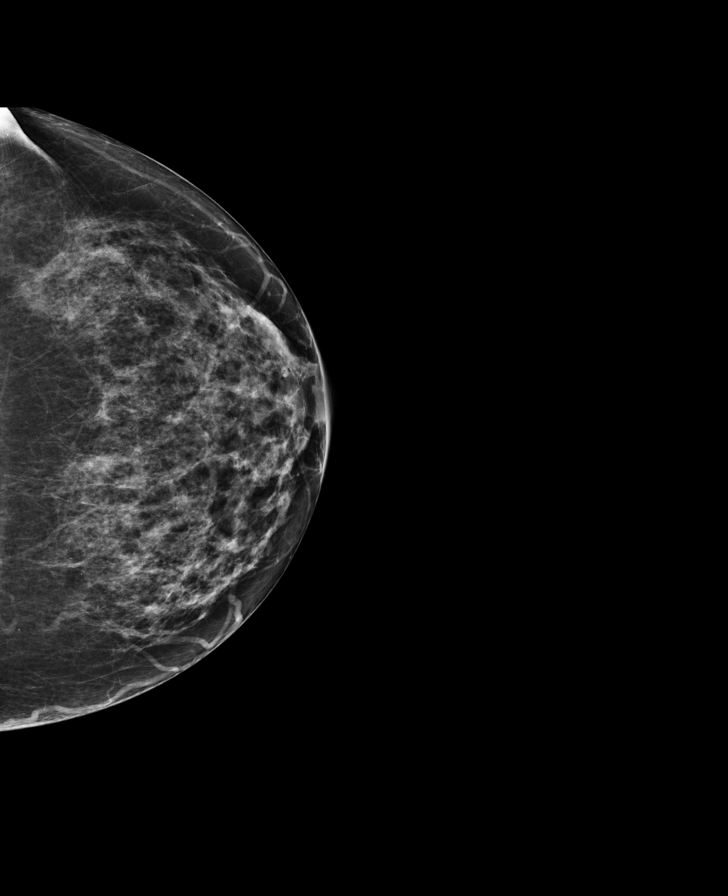

[L MLO synth-2D]
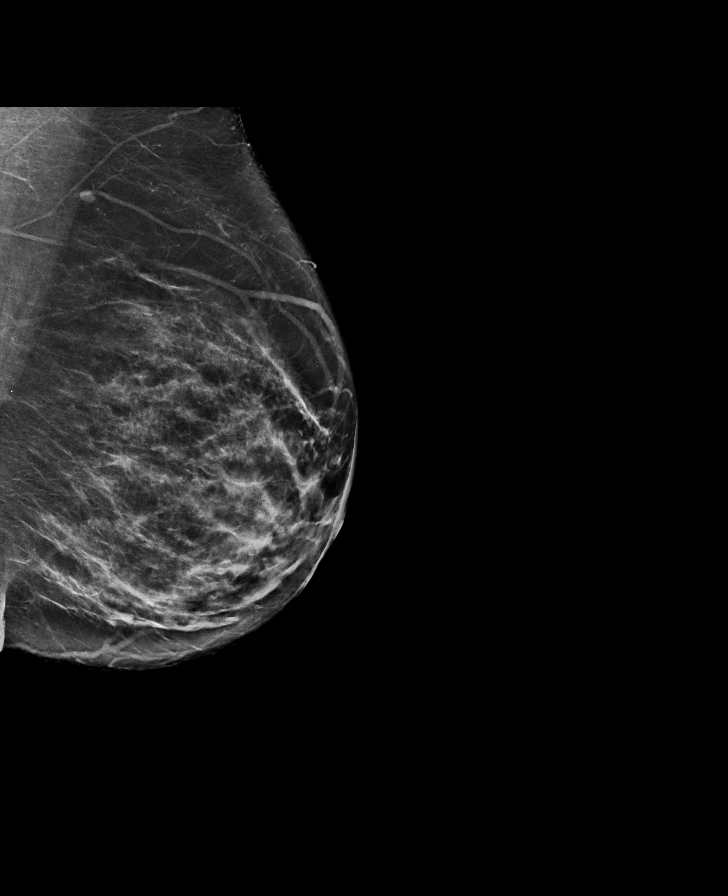

[L CC synth-2D]
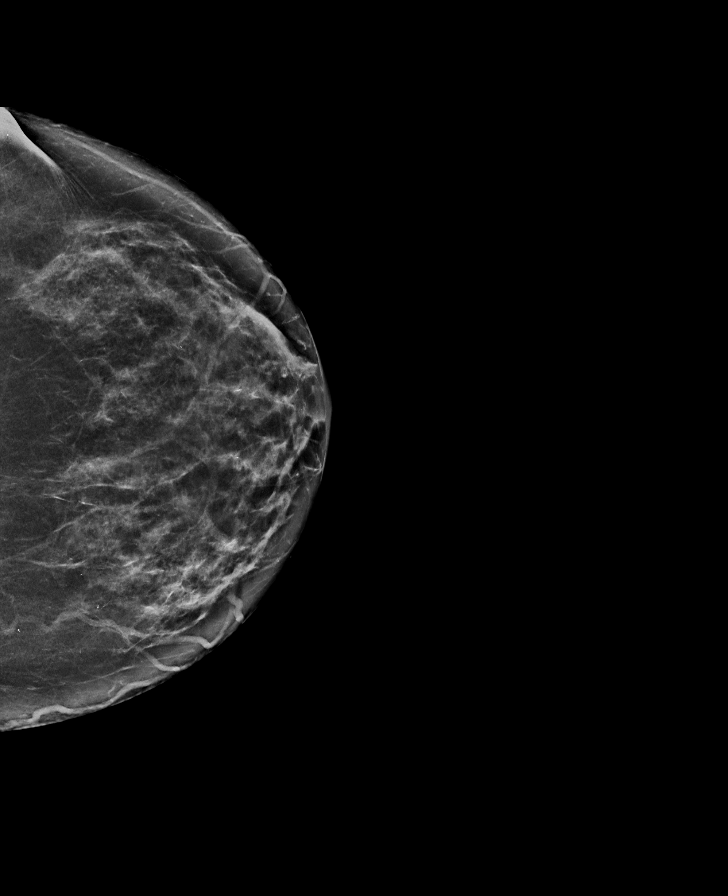

[8 of 28 positions shown; findings below may reference images not displayed]

ACR Breast Density Category b: There are scattered areas of
fibroglandular density.
FINDINGS: There are no findings suspicious for malignancy. Images were
processed with CAD.
IMPRESSION: No mammographic evidence of malignancy. A result letter of this
screening mammogram will be mailed directly to the patient.

RECOMMENDATION:
Screening mammogram in one year. (Code:97-6-RS4)

BI-RADS CATEGORY  1: Negative.

## 2017-06-07 DIAGNOSIS — M9901 Segmental and somatic dysfunction of cervical region: Secondary | ICD-10-CM | POA: Diagnosis not present

## 2017-06-07 DIAGNOSIS — M25511 Pain in right shoulder: Secondary | ICD-10-CM | POA: Diagnosis not present

## 2017-06-12 DIAGNOSIS — N950 Postmenopausal bleeding: Secondary | ICD-10-CM | POA: Diagnosis not present

## 2017-06-20 DIAGNOSIS — N950 Postmenopausal bleeding: Secondary | ICD-10-CM | POA: Diagnosis not present

## 2017-06-28 DIAGNOSIS — M9901 Segmental and somatic dysfunction of cervical region: Secondary | ICD-10-CM | POA: Diagnosis not present

## 2017-06-28 DIAGNOSIS — M25511 Pain in right shoulder: Secondary | ICD-10-CM | POA: Diagnosis not present

## 2017-07-09 DIAGNOSIS — M79671 Pain in right foot: Secondary | ICD-10-CM | POA: Diagnosis not present

## 2017-07-09 DIAGNOSIS — M71571 Other bursitis, not elsewhere classified, right ankle and foot: Secondary | ICD-10-CM | POA: Diagnosis not present

## 2017-07-09 DIAGNOSIS — M2041 Other hammer toe(s) (acquired), right foot: Secondary | ICD-10-CM | POA: Diagnosis not present

## 2017-07-12 DIAGNOSIS — I10 Essential (primary) hypertension: Secondary | ICD-10-CM | POA: Diagnosis not present

## 2017-07-12 DIAGNOSIS — M8580 Other specified disorders of bone density and structure, unspecified site: Secondary | ICD-10-CM | POA: Diagnosis not present

## 2017-07-12 DIAGNOSIS — M9901 Segmental and somatic dysfunction of cervical region: Secondary | ICD-10-CM | POA: Diagnosis not present

## 2017-07-12 DIAGNOSIS — F419 Anxiety disorder, unspecified: Secondary | ICD-10-CM | POA: Diagnosis not present

## 2017-07-12 DIAGNOSIS — J309 Allergic rhinitis, unspecified: Secondary | ICD-10-CM | POA: Diagnosis not present

## 2017-07-12 DIAGNOSIS — M25511 Pain in right shoulder: Secondary | ICD-10-CM | POA: Diagnosis not present

## 2017-07-13 DIAGNOSIS — M792 Neuralgia and neuritis, unspecified: Secondary | ICD-10-CM | POA: Diagnosis not present

## 2017-07-20 DIAGNOSIS — M25571 Pain in right ankle and joints of right foot: Secondary | ICD-10-CM | POA: Diagnosis not present

## 2017-07-23 DIAGNOSIS — M25571 Pain in right ankle and joints of right foot: Secondary | ICD-10-CM | POA: Diagnosis not present

## 2017-08-02 DIAGNOSIS — M2021 Hallux rigidus, right foot: Secondary | ICD-10-CM | POA: Diagnosis not present

## 2017-08-02 DIAGNOSIS — M2041 Other hammer toe(s) (acquired), right foot: Secondary | ICD-10-CM | POA: Diagnosis not present

## 2017-08-02 DIAGNOSIS — L84 Corns and callosities: Secondary | ICD-10-CM | POA: Diagnosis not present

## 2017-08-10 DIAGNOSIS — M12271 Villonodular synovitis (pigmented), right ankle and foot: Secondary | ICD-10-CM | POA: Diagnosis not present

## 2017-08-10 DIAGNOSIS — G5761 Lesion of plantar nerve, right lower limb: Secondary | ICD-10-CM | POA: Diagnosis not present

## 2017-08-31 DIAGNOSIS — K120 Recurrent oral aphthae: Secondary | ICD-10-CM | POA: Diagnosis not present

## 2017-08-31 DIAGNOSIS — J069 Acute upper respiratory infection, unspecified: Secondary | ICD-10-CM | POA: Diagnosis not present

## 2017-09-06 DIAGNOSIS — M25511 Pain in right shoulder: Secondary | ICD-10-CM | POA: Diagnosis not present

## 2017-09-06 DIAGNOSIS — M9901 Segmental and somatic dysfunction of cervical region: Secondary | ICD-10-CM | POA: Diagnosis not present

## 2017-09-17 DIAGNOSIS — M25511 Pain in right shoulder: Secondary | ICD-10-CM | POA: Diagnosis not present

## 2017-09-17 DIAGNOSIS — M9901 Segmental and somatic dysfunction of cervical region: Secondary | ICD-10-CM | POA: Diagnosis not present

## 2017-09-27 DIAGNOSIS — M25511 Pain in right shoulder: Secondary | ICD-10-CM | POA: Diagnosis not present

## 2017-09-27 DIAGNOSIS — M9901 Segmental and somatic dysfunction of cervical region: Secondary | ICD-10-CM | POA: Diagnosis not present

## 2017-10-04 DIAGNOSIS — H6123 Impacted cerumen, bilateral: Secondary | ICD-10-CM | POA: Diagnosis not present

## 2017-10-04 DIAGNOSIS — R05 Cough: Secondary | ICD-10-CM | POA: Diagnosis not present

## 2017-10-04 DIAGNOSIS — M25511 Pain in right shoulder: Secondary | ICD-10-CM | POA: Diagnosis not present

## 2017-10-04 DIAGNOSIS — M9901 Segmental and somatic dysfunction of cervical region: Secondary | ICD-10-CM | POA: Diagnosis not present

## 2017-10-08 DIAGNOSIS — M25511 Pain in right shoulder: Secondary | ICD-10-CM | POA: Diagnosis not present

## 2017-10-08 DIAGNOSIS — M9901 Segmental and somatic dysfunction of cervical region: Secondary | ICD-10-CM | POA: Diagnosis not present

## 2017-10-10 DIAGNOSIS — M25511 Pain in right shoulder: Secondary | ICD-10-CM | POA: Diagnosis not present

## 2017-10-10 DIAGNOSIS — M9901 Segmental and somatic dysfunction of cervical region: Secondary | ICD-10-CM | POA: Diagnosis not present

## 2017-10-24 DIAGNOSIS — R05 Cough: Secondary | ICD-10-CM | POA: Diagnosis not present

## 2017-10-24 LAB — PULMONARY FUNCTION TEST

## 2017-11-14 NOTE — Progress Notes (Signed)
Synopsis: Referred in August 2019 for dry cough, by Hulan Fess, MD  Subjective:   PATIENT ID: Jennifer Evans GENDER: female DOB: September 16, 1960, MRN: [id number]  Chief Complaint  Patient presents with  . pulmonary consult    self referral for persistant cough and tickle in throat    PMH of eczema, seasonal allergies, allergic rhinitis, GERD, present with cough.   Her cough started around may 2019 when she was seen by her PCP for URI symptoms. Since then has been seen 3 times, may, June and July, treated with abx twice and better each time given prednisone daily. She was given albuterol and this helps her symptoms. Her cough is worse at night and better during the day. She occasionally has productive cough. Does have white sputum on occasion.  She has had allergies for a long time over the past 20 years. She has a tickle in her throat that is a sensation she always feels the need to clear.  She saw an allergist a long time ago. She has 58 year old beagle and a new puppy. She is new to the home as of January. She has GERD treated with PPI as well as PND and allergic rhinitis treated with nasal steroid. She routinely uses oxymetazoline at least once per day for chronic nasal congestion. Her husband seems to be worried about this.    Past Medical History:  Diagnosis Date  . Arthritis   . Contact lens/glasses fitting   . Eczema   . Family history of breast cancer   . Hypertension   . Kidney stones   . Menopause   . Nasal congestion   . 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 Sinus headache   . Thyroid disease    hx of thyroid nodule (stable)     Family History  Problem Relation Age of Onset  . Cancer Mother 95       breast  . Breast cancer Mother 80  . Stroke Father 85  . Dementia Father 4  . Cancer Maternal Uncle        colon - about 40 years ago     Social History   Socioeconomic History  . Marital status: Married    Spouse name: Not on file  .  Number of children: Not on file  . Years of education: Not on file  . Highest education level: Not on file  Occupational History  . Not on file  Social Needs  . Financial resource strain: Not on file  . Food insecurity:    Worry: Not on file    Inability: Not on file  . Transportation needs:    Medical: Not on file    Non-medical: Not on file  Tobacco Use  . Smoking status: Never Smoker  . Smokeless tobacco: Never Used  Substance and Sexual Activity  . Alcohol use: Yes    Comment: occasional  . Drug use: No  . Sexual activity: Not on file  Lifestyle  . Physical activity:    Days per week: Not on file    Minutes per session: Not on file  . Stress: Not on file  Relationships  . Social connections:    Talks on phone: Not on file    Gets together: Not on file    Attends religious service: Not on file    Active member of club or organization: Not on file    Attends meetings of clubs or organizations: Not on  file    Relationship status: Not on file  . Intimate partner violence:    Fear of current or ex partner: Not on file    Emotionally abused: Not on file    Physically abused: Not on file    Forced sexual activity: Not on file  Other Topics Concern  . Not on file  Social History Narrative  . Not on file     No Known Allergies   Outpatient Medications Prior to Visit  Medication Sig Dispense Refill  . hydrochlorothiazide (HYDRODIURIL) 12.5 MG tablet Take 12.5 mg by mouth daily.      Marland Kitchen losartan (COZAAR) 25 MG tablet Take 25 mg by mouth daily.    . Multiple Vitamins-Calcium (ONE-A-DAY WOMENS PO) Take by mouth. With calcium in it.     . pregabalin (LYRICA) 300 MG capsule Take 300 mg by mouth 3 (three) times daily.       No facility-administered medications prior to visit.     Review of Systems  Constitutional: Negative for chills, fever, malaise/fatigue and weight loss.  HENT: Positive for congestion and sore throat. Negative for hearing loss, nosebleeds and  tinnitus.   Eyes: Negative for blurred vision and double vision.  Respiratory: Positive for cough. Negative for hemoptysis, sputum production, shortness of breath, wheezing and stridor.   Cardiovascular: Negative for chest pain, palpitations, orthopnea, leg swelling and PND.  Gastrointestinal: Negative for abdominal pain, constipation, diarrhea, heartburn, nausea and vomiting.  Genitourinary: Negative for dysuria, hematuria and urgency.  Musculoskeletal: Negative for joint pain and myalgias.  Skin: Negative for itching and rash.  Neurological: Negative for dizziness, tingling, weakness and headaches.  Endo/Heme/Allergies: Negative for environmental allergies. Does not bruise/bleed easily.  Psychiatric/Behavioral: Negative for depression. The patient is nervous/anxious. The patient does not have insomnia.   All other systems reviewed and are negative.    Objective:  Physical Exam  Constitutional: She is oriented to person, place, and time. She appears well-developed and well-nourished. No distress.  HENT:  Head: Normocephalic and atraumatic.  Nose: Nose normal.  No visible polyps Mouth dry   Eyes: Pupils are equal, round, and reactive to light. Conjunctivae are normal. No scleral icterus.  Neck: Neck supple. No JVD present. No tracheal deviation present.  Cardiovascular: Normal rate, regular rhythm, normal heart sounds and intact distal pulses.  No murmur heard. Pulmonary/Chest: Effort normal and breath sounds normal. No accessory muscle usage or stridor. No tachypnea. No respiratory distress. She has no wheezes. She has no rhonchi. She has no rales.  Abdominal: Soft. Bowel sounds are normal. She exhibits no distension. There is no tenderness.  Musculoskeletal: She exhibits no edema or tenderness.  Lymphadenopathy:    She has no cervical adenopathy.  Neurological: She is alert and oriented to person, place, and time.  Skin: Skin is warm and dry. Capillary refill takes less than 2  seconds. No rash noted.  Psychiatric: She has a normal mood and affect. Her behavior is normal.  Vitals reviewed.    Vitals:   11/15/17 1009  BP: 122/72  Pulse: 97  SpO2: 99%  Weight: 129 lb 14.4 oz (58.9 kg)  Height: 5\' 3"  (1.6 m)   99% on RA BMI Readings from Last 3 Encounters:  11/15/17 23.01 kg/m  01/06/14 21.04 kg/m  02/22/11 21.04 kg/m   Wt Readings from Last 3 Encounters:  11/15/17 129 lb 14.4 oz (58.9 kg)  01/06/14 122 lb 9.2 oz (55.6 kg)  02/22/11 122 lb 9.6 oz (55.6 kg)    CBC  Component Value Date/Time   WBC 5.6 04/28/2010 1119   RBC 4.30 04/28/2010 1119   HGB 13.0 04/28/2010 1119   HGB 12.9 10/15/2008 1013   HCT 39.0 04/28/2010 1119   HCT 37.8 10/15/2008 1013   PLT 351 04/28/2010 1119   PLT 328 10/15/2008 1013   MCV 90.7 04/28/2010 1119   MCV 94.2 10/15/2008 1013   MCH 30.2 04/28/2010 1119   MCHC 33.3 04/28/2010 1119   RDW 13.2 04/28/2010 1119   RDW 14.0 10/15/2008 1013   LYMPHSABS 1.2 04/28/2010 1119   LYMPHSABS 0.9 10/15/2008 1013   MONOABS 0.4 04/28/2010 1119   MONOABS 0.4 10/15/2008 1013   EOSABS 0.2 04/28/2010 1119   EOSABS 0.2 10/15/2008 1013   BASOSABS 0.0 04/28/2010 1119   BASOSABS 0.0 10/15/2008 1013    Chest Imaging: I have independently reviewed the CXR that she brought here on CD Normal CXR with evidence of apical scaring   Pulmonary Functions Testing Results: No results found for: FEV1, FVC, FEV1FVC, TLC, DLCO  FeNO: None   Pathology: None   Echocardiogram: None   Heart Catheterization: None     Assessment & Plan:   Cough - Plan: CBC w/Diff, Resp Allergy Profile Regn2DC [facility] [place] [facility], IgE, Pulmonary Function Test  History of eczema  Seasonal allergies  Seasonal allergic rhinitis, unspecified trigger  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 FM with cough for the past several months. She does have features in her  story consistent with reversible bronchospasm and cough consistent with underlying asthma. Her history of eczema as well as allergies suggest atopy. There are a few confounders to include chronic oxymetazoline use as well as GERD and possible PND. She has been on ARB for some time they have mush less risk to cause cough than ACE(-). I believe we can offer a step wise approach to her possible underlying asthma and treat all potential exacerbating conditions.   Continue reflux medication, PPI once daily Continue daily antihistamine can switch to a different one OTC if desired Continue nasal steroid twice daily  Stop the use of oxymetazoline  Start you on symbicort 160 2 puffs twice daily with spacer  Will give instruction in the office on appropriate use today  Continue as needed albuterol with spacer  Lab work next week, CBC, RAST + IgE, d/t recent steroid taper use  Start vitamin D 2000IU daily  We will get full PFTs as well as FENO on her next visit.  RTC 4-6 weeks    Current Outpatient Medications:  .  hydrochlorothiazide (HYDRODIURIL) 12.5 MG tablet, Take 12.5 mg by mouth daily.  , Disp: , Rfl:  .  losartan (COZAAR) 25 MG tablet, Take 25 mg by mouth daily., Disp: , Rfl:  .  Multiple Vitamins-Calcium (ONE-A-DAY WOMENS PO), Take by mouth. With calcium in it. , Disp: , Rfl:  .  budesonide-formoterol (SYMBICORT) 160-4.5 MCG/ACT inhaler, Inhale 2 puffs into the lungs 2 (two) times daily., Disp: 1 Inhaler, Rfl: 0 .  budesonide-formoterol (SYMBICORT) 160-4.5 MCG/ACT inhaler, Inhale 2 puffs into the lungs 2 (two) times daily., Disp: 1 Inhaler, Rfl: 0 .  Spacer/Aero-Holding Chambers (AEROCHAMBER MINI CHAMBER) DEVI, 1 Device by Does not apply route as directed., Disp: 1 Device, Rfl: 0   Garner Nash, DO [facility] 11/15/2017 11:31 AM

## 2017-11-15 ENCOUNTER — Ambulatory Visit: Payer: BLUE CROSS/BLUE SHIELD | Admitting: Pulmonary Disease

## 2017-11-15 ENCOUNTER — Encounter: Payer: Self-pay | Admitting: Pulmonary Disease

## 2017-11-15 VITALS — BP 122/72 | HR 97 | Ht 63.0 in | Wt 129.9 lb

## 2017-11-15 DIAGNOSIS — R059 Cough, unspecified: Secondary | ICD-10-CM

## 2017-11-15 DIAGNOSIS — J310 Chronic rhinitis: Secondary | ICD-10-CM | POA: Diagnosis not present

## 2017-11-15 DIAGNOSIS — Z8719 Personal history of other diseases of the digestive system: Secondary | ICD-10-CM

## 2017-11-15 DIAGNOSIS — R05 Cough: Secondary | ICD-10-CM

## 2017-11-15 DIAGNOSIS — T485X5A Adverse effect of other anti-common-cold drugs, initial encounter: Secondary | ICD-10-CM

## 2017-11-15 DIAGNOSIS — J302 Other seasonal allergic rhinitis: Secondary | ICD-10-CM | POA: Diagnosis not present

## 2017-11-15 DIAGNOSIS — Z872 Personal history of diseases of the skin and subcutaneous tissue: Secondary | ICD-10-CM

## 2017-11-15 MED ORDER — BUDESONIDE-FORMOTEROL FUMARATE 160-4.5 MCG/ACT IN AERO: 2.0000 | INHALATION_SPRAY | Freq: Two times a day (BID) | RESPIRATORY_TRACT | 0 refills | Status: DC

## 2017-11-15 MED ORDER — AEROCHAMBER MINI CHAMBER DEVI: 1.0000 | 0 refills | Status: AC

## 2017-11-15 NOTE — Patient Instructions (Signed)
Continue reflux medication, PPI once daily Continue daily antihistamine Continue nasal steroid twice daily  Stop the use of oxymetazoline  Start you on symbicort 160 2 puffs twice daily with spacer  Continue as needed albuterol with spacer  Lab work next week, CBC, RAST + IgE  Start vitamin D 2000IU daily

## 2017-11-21 ENCOUNTER — Institutional Professional Consult (permissible substitution): Payer: BLUE CROSS/BLUE SHIELD | Admitting: Pulmonary Disease

## 2017-11-23 ENCOUNTER — Other Ambulatory Visit (INDEPENDENT_AMBULATORY_CARE_PROVIDER_SITE_OTHER): Payer: BLUE CROSS/BLUE SHIELD

## 2017-11-23 DIAGNOSIS — R059 Cough, unspecified: Secondary | ICD-10-CM

## 2017-11-23 DIAGNOSIS — R05 Cough: Secondary | ICD-10-CM | POA: Diagnosis not present

## 2017-11-23 LAB — CBC WITH DIFFERENTIAL/PLATELET
BASOS ABS: 0 10*3/uL (ref 0.0–0.1)
Basophils Relative: 0.6 % (ref 0.0–3.0)
EOS ABS: 0.2 10*3/uL (ref 0.0–0.7)
Eosinophils Relative: 3.7 % (ref 0.0–5.0)
HCT: 41.7 % (ref 36.0–46.0)
Hemoglobin: 13.7 g/dL (ref 12.0–15.0)
LYMPHS ABS: 1.1 10*3/uL (ref 0.7–4.0)
Lymphocytes Relative: 19.8 % (ref 12.0–46.0)
MCHC: 32.7 g/dL (ref 30.0–36.0)
MCV: 95.6 fl (ref 78.0–100.0)
MONO ABS: 0.7 10*3/uL (ref 0.1–1.0)
Monocytes Relative: 13.1 % — ABNORMAL HIGH (ref 3.0–12.0)
NEUTROS ABS: 3.6 10*3/uL (ref 1.4–7.7)
Neutrophils Relative %: 62.8 % (ref 43.0–77.0)
Platelets: 379 10*3/uL (ref 150.0–400.0)
RBC: 4.37 Mil/uL (ref 3.87–5.11)
RDW: 14.5 % (ref 11.5–15.5)
WBC: 5.6 10*3/uL (ref 4.0–10.5)

## 2017-11-26 LAB — RESPIRATORY ALLERGY PROFILE REGION II ~~LOC~~
ALLERGEN, COTTONWOOD, T14: 0.1 kU/L — AB
ALLERGEN, D PTERNOYSSINUS, D1: 0.12 kU/L — AB
Allergen, A. alternata, m6: <0.1 kU/L
Allergen, Cedar tree, t12: <0.1 kU/L
Allergen, Mouse Urine Protein, e78: 0.12 kU/L — ABNORMAL HIGH
Allergen, Mulberry, t76: <0.1 kU/L
Allergen, Oak,t7: <0.1 kU/L
Allergen, P. notatum, m1: <0.1 kU/L
Aspergillus fumigatus, m3: <0.1 kU/L
BERMUDA GRASS: 0.26 kU/L — AB
CLADOSPORIUM HERBARUM (M2) IGE: <0.1 kU/L
CLASS: 0
CLASS: 0
CLASS: 0
CLASS: 0
CLASS: 0
CLASS: 0
CLASS: 0
CLASS: 0
CLASS: 2
Cat Dander: 3.76 kU/L — ABNORMAL HIGH
Class: 0
Class: 0
Class: 0
Class: 0
Class: 0
Class: 0
Class: 0
Class: 0
Class: 0
Class: 0
Class: 0
Class: 1
Class: 1
Class: 2
Class: 3
Cockroach: <0.1 kU/L
D. FARINAE: 0.79 kU/L — AB
DOG DANDER: 0.61 kU/L — AB
ELM IGE: 0.12 kU/L — AB
IGE (IMMUNOGLOBULIN E), SERUM: 55 kU/L (ref <=114)
JOHNSON GRASS: 0.47 kU/L — AB
Pecan/Hickory Tree IgE: <0.1 kU/L
Rough Pigweed  IgE: <0.1 kU/L
Sheep Sorrel IgE: <0.1 kU/L
Timothy Grass: 1.04 kU/L — ABNORMAL HIGH

## 2017-11-26 LAB — INTERPRETATION:

## 2017-11-29 ENCOUNTER — Telehealth: Payer: Self-pay | Admitting: Pulmonary Disease

## 2017-11-29 ENCOUNTER — Ambulatory Visit (INDEPENDENT_AMBULATORY_CARE_PROVIDER_SITE_OTHER): Payer: BLUE CROSS/BLUE SHIELD | Admitting: Pulmonary Disease

## 2017-11-29 DIAGNOSIS — R05 Cough: Secondary | ICD-10-CM

## 2017-11-29 DIAGNOSIS — R059 Cough, unspecified: Secondary | ICD-10-CM

## 2017-11-29 LAB — PULMONARY FUNCTION TEST
DL/VA % PRED: 119 %
DL/VA: 5.65 ml/min/mmHg/L
DLCO UNC: 25.74 ml/min/mmHg
DLCO cor % pred: 108 %
DLCO cor: 25.5 ml/min/mmHg
DLCO unc % pred: 109 %
FEF 25-75 POST: 1.94 L/s
FEF 25-75 Pre: 2.01 L/sec
FEF2575-%CHANGE-POST: -3 %
FEF2575-%Pred-Post: 80 %
FEF2575-%Pred-Pre: 83 %
FEV1-%CHANGE-POST: -1 %
FEV1-%PRED-POST: 96 %
FEV1-%Pred-Pre: 97 %
FEV1-PRE: 2.5 L
FEV1-Post: 2.46 L
FEV1FVC-%CHANGE-POST: 5 %
FEV1FVC-%PRED-PRE: 95 %
FEV6-%Change-Post: -7 %
FEV6-%Pred-Post: 97 %
FEV6-%Pred-Pre: 104 %
FEV6-PRE: 3.34 L
FEV6-Post: 3.11 L
FEV6FVC-%Change-Post: 0 %
FEV6FVC-%PRED-PRE: 103 %
FEV6FVC-%Pred-Post: 103 %
FVC-%Change-Post: -6 %
FVC-%PRED-PRE: 101 %
FVC-%Pred-Post: 94 %
FVC-PRE: 3.34 L
FVC-Post: 3.11 L
POST FEV1/FVC RATIO: 79 %
PRE FEV6/FVC RATIO: 100 %
Post FEV6/FVC ratio: 100 %
Pre FEV1/FVC ratio: 75 %
RV % PRED: 89 %
RV: 1.71 L
TLC % pred: 95 %
TLC: 4.73 L

## 2017-11-29 NOTE — Telephone Encounter (Signed)
Hoarseness is a potential side effect of the Symbicort. Please have her stop it completely, keep track of how both her breathing and hoarseness change so she can discuss w Dr Valeta Harms.

## 2017-11-29 NOTE — Telephone Encounter (Signed)
Pt is aware of recommendations and voiced her understanding.  Nothing further is needed.  

## 2017-11-29 NOTE — Progress Notes (Signed)
PFT done today. 

## 2017-11-29 NOTE — Telephone Encounter (Addendum)
Called and spoke to pt. Pt stated that cough has improved with Symbicort, however she has developed hoarseness. Pt is wanting to know if she should decrease usage of symbicort to once daily.  Pt is requesting a call back today if possible.   RB please advise, as Dr. Valeta Harms is unavailable.

## 2017-12-05 ENCOUNTER — Other Ambulatory Visit: Payer: Self-pay | Admitting: Pulmonary Disease

## 2017-12-05 MED ORDER — BUDESONIDE-FORMOTEROL FUMARATE 160-4.5 MCG/ACT IN AERO: 2.0000 | INHALATION_SPRAY | Freq: Two times a day (BID) | RESPIRATORY_TRACT | 0 refills | Status: AC

## 2017-12-18 DIAGNOSIS — Z13820 Encounter for screening for osteoporosis: Secondary | ICD-10-CM | POA: Diagnosis not present

## 2017-12-18 DIAGNOSIS — Z6822 Body mass index (BMI) 22.0-22.9, adult: Secondary | ICD-10-CM | POA: Diagnosis not present

## 2017-12-18 DIAGNOSIS — Z01419 Encounter for gynecological examination (general) (routine) without abnormal findings: Secondary | ICD-10-CM | POA: Diagnosis not present

## 2017-12-30 NOTE — Progress Notes (Signed)
Synopsis: Referred in August 2019 for dry cough, by Hulan Fess, MD  Subjective:   PATIENT ID: Jennifer Evans GENDER: female DOB: 1960/06/14, MRN: [id number]  Chief Complaint  Patient presents with  . Follow-up    states her cough has improved, she does still have mucous in the mornings but cough has resolved.    PMH of eczema, seasonal allergies, allergic rhinitis, GERD, present with cough. Her cough started around may 2019 when she was seen by her PCP for URI symptoms. Since then has been seen 3 times, may, June and July, treated with abx twice and better each time given prednisone daily. She was given albuterol and this helps her symptoms. Her cough is worse at night and better during the day. She has had allergies for a long time over the past 20 years. She has a tickle in her throat that is a sensation she always feels the need to clear.  She saw an allergist a long time ago. She has 58 year old beagle and a new puppy. She is new to the home as of January. She has GERD treated with PPI as well as PND and allergic rhinitis treated with nasal steroid. She routinely uses oxymetazoline at least once per day for chronic nasal congestion. Her husband seems to be worried about this.   OV 12/31/2017: Patient has been doing very well since her last visit.  She states the addition of daily antihistamine, PPI, Flonase as well as use of Symbicort improved her allergy symptoms as well as her cough and wheezing.  After a few weeks of improvement she stopped with the use of Symbicort and has no longer been using the inhaler and is currently moderately compliant with the remaining regimen.  She does state that she has been taking her antihistamine daily.  She did develop some mild hoarseness of voice for short period and was concerned that the inhaled steroid from the inhaler may have caused this.  But she does realize she had been coughing for some time and could have been related to that coincidentally.   She denies fevers, chills, night sweats, daily sputum production, hemoptysis.   Past Medical History:  Diagnosis Date  . Arthritis   . Contact lens/glasses fitting   . Eczema   . Family history of breast cancer   . Hypertension   . Kidney stones   . Menopause   . Nasal congestion   . 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 Sinus headache   . Thyroid disease    hx of thyroid nodule (stable)     Family History  Problem Relation Age of Onset  . Cancer Mother 46       breast  . Breast cancer Mother 53  . Stroke Father 48  . Dementia Father 43  . Cancer Maternal Uncle        colon - about 40 years ago     Social History   Socioeconomic History  . Marital status: Married    Spouse name: Not on file  . Number of children: Not on file  . Years of education: Not on file  . Highest education level: Not on file  Occupational History  . Not on file  Social Needs  . Financial resource strain: Not on file  . Food insecurity:    Worry: Not on file    Inability: Not on file  . Transportation needs:    Medical: Not on file  Non-medical: Not on file  Tobacco Use  . Smoking status: Never Smoker  . Smokeless tobacco: Never Used  Substance and Sexual Activity  . Alcohol use: Yes    Comment: occasional  . Drug use: No  . Sexual activity: Not on file  Lifestyle  . Physical activity:    Days per week: Not on file    Minutes per session: Not on file  . Stress: Not on file  Relationships  . Social connections:    Talks on phone: Not on file    Gets together: Not on file    Attends religious service: Not on file    Active member of club or organization: Not on file    Attends meetings of clubs or organizations: Not on file    Relationship status: Not on file  . Intimate partner violence:    Fear of current or ex partner: Not on file    Emotionally abused: Not on file    Physically abused: Not on file    Forced sexual activity: Not on file  Other Topics  Concern  . Not on file  Social History Narrative  . Not on file     No Known Allergies   Outpatient Medications Prior to Visit  Medication Sig Dispense Refill  . budesonide-formoterol (SYMBICORT) 160-4.5 MCG/ACT inhaler Inhale 2 puffs into the lungs 2 (two) times daily. 1 Inhaler 0  . hydrochlorothiazide (HYDRODIURIL) 12.5 MG tablet Take 12.5 mg by mouth daily.      Marland Kitchen losartan (COZAAR) 25 MG tablet Take 25 mg by mouth daily.    . Multiple Vitamins-Calcium (ONE-A-DAY WOMENS PO) Take by mouth. With calcium in it.     Marland Kitchen Spacer/Aero-Holding Chambers (AEROCHAMBER MINI CHAMBER) DEVI 1 Device by Does not apply route as directed. 1 Device 0  . budesonide-formoterol (SYMBICORT) 160-4.5 MCG/ACT inhaler Inhale 2 puffs into the lungs 2 (two) times daily. (Patient not taking: Reported on 12/31/2017) 1 Inhaler 0   No facility-administered medications prior to visit.     Review of Systems  Constitutional: Negative for chills, fever, malaise/fatigue and weight loss.  HENT: Negative for congestion, hearing loss, nosebleeds, sore throat and tinnitus.   Eyes: Negative for blurred vision and double vision.  Respiratory: Positive for cough. Negative for hemoptysis, sputum production, shortness of breath, wheezing and stridor.   Cardiovascular: Negative for chest pain, palpitations, orthopnea, leg swelling and PND.  Gastrointestinal: Negative for abdominal pain, constipation, diarrhea, heartburn, nausea and vomiting.  Genitourinary: Negative for dysuria, hematuria and urgency.  Musculoskeletal: Negative for joint pain and myalgias.  Skin: Negative for itching and rash.  Neurological: Negative for dizziness, tingling, weakness and headaches.  Endo/Heme/Allergies: Negative for environmental allergies. Does not bruise/bleed easily.  Psychiatric/Behavioral: Negative for depression. The patient is not nervous/anxious and does not have insomnia.   All other systems reviewed and are negative.    Objective:    Physical Exam  Constitutional: She is oriented to person, place, and time. She appears well-developed and well-nourished. No distress.  HENT:  Head: Normocephalic and atraumatic.  Nose: Nose normal.  Mouth/Throat: Oropharynx is clear and moist.  No visible polyps Mouth dry   Eyes: Pupils are equal, round, and reactive to light. Conjunctivae are normal. No scleral icterus.  Neck: Neck supple. No JVD present. No tracheal deviation present.  Cardiovascular: Normal rate, regular rhythm, normal heart sounds and intact distal pulses.  No murmur heard. Pulmonary/Chest: Effort normal and breath sounds normal. No accessory muscle usage or stridor. No tachypnea. No  respiratory distress. She has no wheezes. She has no rhonchi. She has no rales.  Abdominal: Soft. She exhibits no distension. There is no tenderness.  Musculoskeletal: She exhibits no edema or tenderness.  Lymphadenopathy:    She has no cervical adenopathy.  Neurological: She is alert and oriented to person, place, and time.  Skin: Skin is warm and dry. Capillary refill takes less than 2 seconds. No rash noted.  Psychiatric: She has a normal mood and affect. Her behavior is normal.  Vitals reviewed.    Vitals:   12/31/17 1043  BP: 128/88  Pulse: 76  SpO2: 98%  Weight: 133 lb 3.2 oz (60.4 kg)  Height: 5\' 3"  (1.6 m)   98% on RA BMI Readings from Last 3 Encounters:  12/31/17 23.60 kg/m  11/15/17 23.01 kg/m  01/06/14 21.04 kg/m   Wt Readings from Last 3 Encounters:  12/31/17 133 lb 3.2 oz (60.4 kg)  11/15/17 129 lb 14.4 oz (58.9 kg)  01/06/14 122 lb 9.2 oz (55.6 kg)    CBC    Component Value Date/Time   WBC 5.6 11/23/2017 1257   RBC 4.37 11/23/2017 1257   HGB 13.7 11/23/2017 1257   HGB 12.9 10/15/2008 1013   HCT 41.7 11/23/2017 1257   HCT 37.8 10/15/2008 1013   PLT 379.0 11/23/2017 1257   PLT 328 10/15/2008 1013   MCV 95.6 11/23/2017 1257   MCV 94.2 10/15/2008 1013   MCH 30.2 04/28/2010 1119   MCHC 32.7  11/23/2017 1257   RDW 14.5 11/23/2017 1257   RDW 14.0 10/15/2008 1013   LYMPHSABS 1.1 11/23/2017 1257   LYMPHSABS 0.9 10/15/2008 1013   MONOABS 0.7 11/23/2017 1257   MONOABS 0.4 10/15/2008 1013   EOSABS 0.2 11/23/2017 1257   EOSABS 0.2 10/15/2008 1013   BASOSABS 0.0 11/23/2017 1257   BASOSABS 0.0 10/15/2008 1013    11/23/2017: Regional allergy panel: Positive for dust mites, cat dander, dog dander, [place], [place], [place] grass and [place] grass and [place] grass.,  Mouse urine.  Serum IgE 55  Chest Imaging: Prior chest imaging reviewed, small area of apical scarring.  Pulmonary Functions Testing Results: Full PFTs 11/29/2017 FVC 3.1 L, 94% predicted postbronchodilator response, FEV1 2.46 L, 96% predicted postbronchodilator,  no significant bronchodilator response, ratio 79 TLC 95% predicted RV/TLC ratio 92% predicted DLCO 109% predicted  FeNO: None   Pathology: None   Echocardiogram: None   Heart Catheterization: None     Assessment & Plan:   Moderate persistent asthma without complication - Plan: budesonide-formoterol (SYMBICORT) 160-4.5 MCG/ACT inhaler  Seasonal allergies  Seasonal allergic rhinitis, unspecified trigger  Rhinitis medicamentosa  History of gastroesophageal reflux (GERD)  History of eczema  Discussion:  This is a 57 year old female with a history of eczema, seasonal allergies, positive regional allergy panel, IgE 55 with recurrent episodes of bronchitis treated with prednisone and improvement.  Ultimately I believe her cough and shortness of breath was related to exacerbation of underlying asthma.  She has mild intermittent asthma and has required prednisone therapy 3 times within the past year.   She also has gastroesophageal reflux disease which I believe exacerbates her underlying asthma in conjunction with PND and seasonal allergic rhinitis.  She has been self-medicating her chronic nasal congestion with oxymetazoline.  Per the patient she  has cut back on her use and this is only been used a few times per week.  Plan:  She should continue her acid suppression and reflux medication Continue daily antihistamine Continue intranasal steroid Stop use  of oxymetazoline Continue vitamin D supplementation At this point would consider scaling back her Symbicort to as needed or starting at the beginning of her worst season.  She states her worst season is usually spring into summer.  Therefore was discussed with the patient starting regular use of her Symbicort in early spring and continuing it through the end of summer. She should keep her albuterol inhaler on her for as needed symptoms. She was counseled on allergen exposure as well as mitigation of dust mites  Return to clinic if symptoms worsen or 1 year.   Current Outpatient Medications:  .  budesonide-formoterol (SYMBICORT) 160-4.5 MCG/ACT inhaler, Inhale 2 puffs into the lungs 2 (two) times daily., Disp: 1 Inhaler, Rfl: 0 .  hydrochlorothiazide (HYDRODIURIL) 12.5 MG tablet, Take 12.5 mg by mouth daily.  , Disp: , Rfl:  .  losartan (COZAAR) 25 MG tablet, Take 25 mg by mouth daily., Disp: , Rfl:  .  Multiple Vitamins-Calcium (ONE-A-DAY WOMENS PO), Take by mouth. With calcium in it. , Disp: , Rfl:  .  Spacer/Aero-Holding Chambers (AEROCHAMBER MINI CHAMBER) DEVI, 1 Device by Does not apply route as directed., Disp: 1 Device, Rfl: 0 .  budesonide-formoterol (SYMBICORT) 160-4.5 MCG/ACT inhaler, Inhale 2 puffs into the lungs every 12 (twelve) hours., Disp: 1 Inhaler, Rfl: [place], DO [facility] 12/31/2017 7:42 PM

## 2017-12-31 ENCOUNTER — Encounter: Payer: Self-pay | Admitting: Pulmonary Disease

## 2017-12-31 ENCOUNTER — Ambulatory Visit: Payer: BLUE CROSS/BLUE SHIELD | Admitting: Pulmonary Disease

## 2017-12-31 VITALS — BP 128/88 | HR 76 | Ht 63.0 in | Wt 133.2 lb

## 2017-12-31 DIAGNOSIS — J310 Chronic rhinitis: Secondary | ICD-10-CM | POA: Diagnosis not present

## 2017-12-31 DIAGNOSIS — T485X5A Adverse effect of other anti-common-cold drugs, initial encounter: Secondary | ICD-10-CM | POA: Insufficient documentation

## 2017-12-31 DIAGNOSIS — J302 Other seasonal allergic rhinitis: Secondary | ICD-10-CM | POA: Diagnosis not present

## 2017-12-31 DIAGNOSIS — Z8719 Personal history of other diseases of the digestive system: Secondary | ICD-10-CM

## 2017-12-31 DIAGNOSIS — Z872 Personal history of diseases of the skin and subcutaneous tissue: Secondary | ICD-10-CM

## 2017-12-31 DIAGNOSIS — J4520 Mild intermittent asthma, uncomplicated: Secondary | ICD-10-CM

## 2017-12-31 DIAGNOSIS — J4540 Moderate persistent asthma, uncomplicated: Secondary | ICD-10-CM | POA: Insufficient documentation

## 2017-12-31 MED ORDER — BUDESONIDE-FORMOTEROL FUMARATE 160-4.5 MCG/ACT IN AERO: 2.0000 | INHALATION_SPRAY | Freq: Two times a day (BID) | RESPIRATORY_TRACT | 11 refills | Status: DC

## 2017-12-31 NOTE — Patient Instructions (Signed)
Continue acid suppression for reflux as well as nasal steroid for rhinitis symptoms. Continue Symbicort during your worst allergy seasons to consider a few months during the springtime as well as the fall.  Please let us know if you are having worsening asthma symptoms or feel the need to continue daily dose Symbicort.  Keep albuterol available for as needed shortness of breath and wheezing.  Return to clinic if symptoms worsen or as needed in 1 year.

## 2018-01-09 DIAGNOSIS — M25511 Pain in right shoulder: Secondary | ICD-10-CM | POA: Diagnosis not present

## 2018-01-09 DIAGNOSIS — M9901 Segmental and somatic dysfunction of cervical region: Secondary | ICD-10-CM | POA: Diagnosis not present

## 2018-01-10 DIAGNOSIS — H9202 Otalgia, left ear: Secondary | ICD-10-CM | POA: Diagnosis not present

## 2018-02-21 DIAGNOSIS — M25511 Pain in right shoulder: Secondary | ICD-10-CM | POA: Diagnosis not present

## 2018-02-21 DIAGNOSIS — M9901 Segmental and somatic dysfunction of cervical region: Secondary | ICD-10-CM | POA: Diagnosis not present

## 2018-02-26 DIAGNOSIS — M9901 Segmental and somatic dysfunction of cervical region: Secondary | ICD-10-CM | POA: Diagnosis not present

## 2018-02-26 DIAGNOSIS — M25511 Pain in right shoulder: Secondary | ICD-10-CM | POA: Diagnosis not present

## 2018-02-28 DIAGNOSIS — M9901 Segmental and somatic dysfunction of cervical region: Secondary | ICD-10-CM | POA: Diagnosis not present

## 2018-02-28 DIAGNOSIS — M25511 Pain in right shoulder: Secondary | ICD-10-CM | POA: Diagnosis not present

## 2018-03-28 DIAGNOSIS — M25511 Pain in right shoulder: Secondary | ICD-10-CM | POA: Diagnosis not present

## 2018-03-28 DIAGNOSIS — M9901 Segmental and somatic dysfunction of cervical region: Secondary | ICD-10-CM | POA: Diagnosis not present

## 2018-04-01 DIAGNOSIS — M25511 Pain in right shoulder: Secondary | ICD-10-CM | POA: Diagnosis not present

## 2018-04-01 DIAGNOSIS — M9901 Segmental and somatic dysfunction of cervical region: Secondary | ICD-10-CM | POA: Diagnosis not present

## 2018-04-09 DIAGNOSIS — M25511 Pain in right shoulder: Secondary | ICD-10-CM | POA: Diagnosis not present

## 2018-04-09 DIAGNOSIS — M9901 Segmental and somatic dysfunction of cervical region: Secondary | ICD-10-CM | POA: Diagnosis not present

## 2018-05-09 DIAGNOSIS — M25511 Pain in right shoulder: Secondary | ICD-10-CM | POA: Diagnosis not present

## 2018-05-09 DIAGNOSIS — M9901 Segmental and somatic dysfunction of cervical region: Secondary | ICD-10-CM | POA: Diagnosis not present

## 2018-05-16 DIAGNOSIS — M9901 Segmental and somatic dysfunction of cervical region: Secondary | ICD-10-CM | POA: Diagnosis not present

## 2018-05-16 DIAGNOSIS — M25511 Pain in right shoulder: Secondary | ICD-10-CM | POA: Diagnosis not present

## 2018-05-27 DIAGNOSIS — S0502XA Injury of conjunctiva and corneal abrasion without foreign body, left eye, initial encounter: Secondary | ICD-10-CM | POA: Diagnosis not present

## 2018-07-02 DIAGNOSIS — M9901 Segmental and somatic dysfunction of cervical region: Secondary | ICD-10-CM | POA: Diagnosis not present

## 2018-07-02 DIAGNOSIS — M25511 Pain in right shoulder: Secondary | ICD-10-CM | POA: Diagnosis not present

## 2018-07-09 DIAGNOSIS — M9901 Segmental and somatic dysfunction of cervical region: Secondary | ICD-10-CM | POA: Diagnosis not present

## 2018-07-09 DIAGNOSIS — M25511 Pain in right shoulder: Secondary | ICD-10-CM | POA: Diagnosis not present

## 2018-07-16 DIAGNOSIS — M25511 Pain in right shoulder: Secondary | ICD-10-CM | POA: Diagnosis not present

## 2018-07-16 DIAGNOSIS — M9901 Segmental and somatic dysfunction of cervical region: Secondary | ICD-10-CM | POA: Diagnosis not present

## 2018-07-23 DIAGNOSIS — M25511 Pain in right shoulder: Secondary | ICD-10-CM | POA: Diagnosis not present

## 2018-07-23 DIAGNOSIS — M9901 Segmental and somatic dysfunction of cervical region: Secondary | ICD-10-CM | POA: Diagnosis not present

## 2018-07-24 DIAGNOSIS — I10 Essential (primary) hypertension: Secondary | ICD-10-CM | POA: Diagnosis not present

## 2018-07-24 DIAGNOSIS — Z87442 Personal history of urinary calculi: Secondary | ICD-10-CM | POA: Diagnosis not present

## 2018-07-24 DIAGNOSIS — M8580 Other specified disorders of bone density and structure, unspecified site: Secondary | ICD-10-CM | POA: Diagnosis not present

## 2018-07-24 DIAGNOSIS — F419 Anxiety disorder, unspecified: Secondary | ICD-10-CM | POA: Diagnosis not present

## 2018-07-25 DIAGNOSIS — I10 Essential (primary) hypertension: Secondary | ICD-10-CM | POA: Diagnosis not present

## 2018-07-30 DIAGNOSIS — M9901 Segmental and somatic dysfunction of cervical region: Secondary | ICD-10-CM | POA: Diagnosis not present

## 2018-07-30 DIAGNOSIS — M25511 Pain in right shoulder: Secondary | ICD-10-CM | POA: Diagnosis not present

## 2018-09-26 DIAGNOSIS — M9901 Segmental and somatic dysfunction of cervical region: Secondary | ICD-10-CM | POA: Diagnosis not present

## 2018-09-26 DIAGNOSIS — M25511 Pain in right shoulder: Secondary | ICD-10-CM | POA: Diagnosis not present

## 2018-09-30 DIAGNOSIS — R945 Abnormal results of liver function studies: Secondary | ICD-10-CM | POA: Diagnosis not present

## 2018-10-02 DIAGNOSIS — D229 Melanocytic nevi, unspecified: Secondary | ICD-10-CM | POA: Diagnosis not present

## 2018-10-02 DIAGNOSIS — L570 Actinic keratosis: Secondary | ICD-10-CM | POA: Diagnosis not present

## 2018-10-02 DIAGNOSIS — L728 Other follicular cysts of the skin and subcutaneous tissue: Secondary | ICD-10-CM | POA: Diagnosis not present

## 2018-10-03 ENCOUNTER — Other Ambulatory Visit: Payer: Self-pay | Admitting: Obstetrics and Gynecology

## 2018-10-03 ENCOUNTER — Other Ambulatory Visit: Payer: Self-pay

## 2018-10-03 ENCOUNTER — Ambulatory Visit
Admission: RE | Admit: 2018-10-03 | Discharge: 2018-10-03 | Disposition: A | Discharge status: Home / Self Care | Payer: BC Managed Care – PPO | Source: Ambulatory Visit | Attending: Obstetrics and Gynecology | Admitting: Obstetrics and Gynecology

## 2018-10-03 DIAGNOSIS — Z1231 Encounter for screening mammogram for malignant neoplasm of breast: Secondary | ICD-10-CM

## 2018-10-17 DIAGNOSIS — R05 Cough: Secondary | ICD-10-CM | POA: Diagnosis not present

## 2018-10-18 DIAGNOSIS — R05 Cough: Secondary | ICD-10-CM | POA: Diagnosis not present

## 2018-10-31 DIAGNOSIS — M25511 Pain in right shoulder: Secondary | ICD-10-CM | POA: Diagnosis not present

## 2018-10-31 DIAGNOSIS — M9901 Segmental and somatic dysfunction of cervical region: Secondary | ICD-10-CM | POA: Diagnosis not present

## 2018-11-04 DIAGNOSIS — R05 Cough: Secondary | ICD-10-CM | POA: Diagnosis not present

## 2018-11-04 DIAGNOSIS — K219 Gastro-esophageal reflux disease without esophagitis: Secondary | ICD-10-CM | POA: Diagnosis not present

## 2018-11-04 DIAGNOSIS — J4541 Moderate persistent asthma with (acute) exacerbation: Secondary | ICD-10-CM | POA: Diagnosis not present

## 2018-11-06 DIAGNOSIS — M9901 Segmental and somatic dysfunction of cervical region: Secondary | ICD-10-CM | POA: Diagnosis not present

## 2018-11-06 DIAGNOSIS — M25511 Pain in right shoulder: Secondary | ICD-10-CM | POA: Diagnosis not present

## 2018-11-07 DIAGNOSIS — J4540 Moderate persistent asthma, uncomplicated: Secondary | ICD-10-CM | POA: Diagnosis not present

## 2018-11-08 ENCOUNTER — Other Ambulatory Visit: Payer: Self-pay

## 2018-11-08 ENCOUNTER — Ambulatory Visit: Payer: BC Managed Care – PPO | Admitting: Primary Care

## 2018-11-08 ENCOUNTER — Ambulatory Visit (INDEPENDENT_AMBULATORY_CARE_PROVIDER_SITE_OTHER): Payer: BC Managed Care – PPO

## 2018-11-08 ENCOUNTER — Encounter: Payer: Self-pay | Admitting: Primary Care

## 2018-11-08 VITALS — BP 118/76 | HR 82 | Temp 97.6°F | Ht 64.0 in | Wt 132.6 lb

## 2018-11-08 DIAGNOSIS — J4520 Mild intermittent asthma, uncomplicated: Secondary | ICD-10-CM | POA: Diagnosis not present

## 2018-11-08 DIAGNOSIS — J45909 Unspecified asthma, uncomplicated: Secondary | ICD-10-CM | POA: Diagnosis not present

## 2018-11-08 NOTE — Progress Notes (Signed)
@Patient  ID: Jennifer Evans, female    DOB: February 01, 1961, 58 y.o.   MRN: [id number]  Chief Complaint  Patient presents with  . Cough    Restarted in the last 3 weeks.    Referring provider: Hulan Fess, MD  HPI: 58 year old female, never smoked. PMH significant for mild intermittent asthma, recurrent bronchitis, seasonal allergic rhinitis, thyroid nodule, GERD, eczema. Patient of Dr. Valeta Harms, last seen on 12/31/17 for cough felt to be d/t underlying asthma. Maintained on prn Symbicort 160, PPI, antihistamine and flonase. IgE 55.   11/08/2018 Patient presents today for acute visit with complaints of allergy symptoms. Allergy symptoms started 1 month ago. States that when she takes a deep breath or laughs it causes her to cough. Saw PCP and was given prednisone burst with improvement in her symptoms. She has been using Symbicort inahler for the last month.  Re-started Singulair yesterday and protonix 4 days ago. Denies shortness of breath, wheeze or chest tightness.    No Known Allergies   There is no immunization history on file for this patient.  Past Medical History:  Diagnosis Date  . Arthritis   . Contact lens/glasses fitting   . Eczema   . Family history of breast cancer   . Hypertension   . Kidney stones   . Menopause   . Nasal congestion   . 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 Sinus headache   . Thyroid disease    hx of thyroid nodule (stable)    Tobacco History: Social History   Tobacco Use  Smoking Status Never Smoker  Smokeless Tobacco Never Used   Counseling given: No   Outpatient Medications Prior to Visit  Medication Sig Dispense Refill  . budesonide-formoterol (SYMBICORT) 160-4.5 MCG/ACT inhaler Inhale 2 puffs into the lungs 2 (two) times daily. 1 Inhaler 0  . losartan (COZAAR) 25 MG tablet Take 25 mg by mouth daily.    . Multiple Vitamins-Calcium (ONE-A-DAY WOMENS PO) Take by mouth. With calcium in it.     Marland Kitchen Spacer/Aero-Holding  Chambers (AEROCHAMBER MINI CHAMBER) DEVI 1 Device by Does not apply route as directed. 1 Device 0  . budesonide-formoterol (SYMBICORT) 160-4.5 MCG/ACT inhaler Inhale 2 puffs into the lungs every 12 (twelve) hours. 1 Inhaler 11  . fluticasone (FLONASE) 50 MCG/ACT nasal spray daily as needed.    . hydrochlorothiazide (HYDRODIURIL) 12.5 MG tablet Take 12.5 mg by mouth daily.      . montelukast (SINGULAIR) 10 MG tablet Take 10 mg by mouth daily.    . pantoprazole (PROTONIX) 40 MG tablet Take 1 tablet by mouth daily.    . predniSONE (DELTASONE) 20 MG tablet Take 1 tablet by mouth. 9 day taper - but not started yet by patient as of 11/08/18     No facility-administered medications prior to visit.     Review of Systems  Review of Systems  Constitutional: Negative.   Respiratory: Positive for cough. Negative for shortness of breath and wheezing.   Cardiovascular: Negative.    Physical Exam  BP 118/76 (BP Location: Left Arm, Patient Position: Sitting, Cuff Size: Normal)   Pulse 82   Temp 97.6 F (36.4 C)   Ht 5' 4"  (1.626 m)   Wt 132 lb 9.6 oz (60.1 kg)   SpO2 95%   BMI 22.76 kg/m  Physical Exam Constitutional:      Appearance: Normal appearance.  HENT:     Right Ear: Tympanic membrane normal.  Left Ear: Tympanic membrane normal.     Nose: Nose normal.     Mouth/Throat:     Mouth: Mucous membranes are moist.     Pharynx: Oropharynx is clear.  Neck:     Musculoskeletal: Normal range of motion and neck supple.  Cardiovascular:     Rate and Rhythm: Normal rate and regular rhythm.  Pulmonary:     Effort: Pulmonary effort is normal.     Breath sounds: No wheezing.     Comments: ? Fine crackles right base Musculoskeletal: Normal range of motion.  Skin:    General: Skin is warm and dry.  Neurological:     General: No focal deficit present.     Mental Status: She is alert and oriented to person, place, and time. Mental status is at baseline.  Psychiatric:        Mood and  Affect: Mood normal.        Behavior: Behavior normal.        Thought Content: Thought content normal.        Judgment: Judgment normal.      Lab Results:  CBC    Component Value Date/Time   WBC 5.6 11/23/2017 1257   RBC 4.37 11/23/2017 1257   HGB 13.7 11/23/2017 1257   HGB 12.9 10/15/2008 1013   HCT 41.7 11/23/2017 1257   HCT 37.8 10/15/2008 1013   PLT 379.0 11/23/2017 1257   PLT 328 10/15/2008 1013   MCV 95.6 11/23/2017 1257   MCV 94.2 10/15/2008 1013   MCH 30.2 04/28/2010 1119   MCHC 32.7 11/23/2017 1257   RDW 14.5 11/23/2017 1257   RDW 14.0 10/15/2008 1013   LYMPHSABS 1.1 11/23/2017 1257   LYMPHSABS 0.9 10/15/2008 1013   MONOABS 0.7 11/23/2017 1257   MONOABS 0.4 10/15/2008 1013   EOSABS 0.2 11/23/2017 1257   EOSABS 0.2 10/15/2008 1013   BASOSABS 0.0 11/23/2017 1257   BASOSABS 0.0 10/15/2008 1013    BMET    Component Value Date/Time   NA 145 04/28/2010 1119   K 4.1 04/28/2010 1119   CL 108 04/28/2010 1119   CO2 27 04/28/2010 1119   GLUCOSE 137 (H) 04/28/2010 1119   BUN 12 04/28/2010 1119   CREATININE 0.9 04/28/2010 1119   CALCIUM 10.2 04/28/2010 1119   GFRNONAA >60 04/28/2010 1119   GFRAA  04/28/2010 1119    >60        The eGFR has been calculated using the MDRD equation. This calculation has not been validated in all clinical situations. eGFR's persistently <60 mL/min signify possible Chronic Kidney Disease.    BNP No results found for: BNP  ProBNP No results found for: PROBNP  Imaging: Dg Chest 2 View  Result Date: 11/08/2018 CLINICAL DATA:  Order for cough mild intermittent asthma in adult without complication EXAM: CHEST - 2 VIEW COMPARISON:  Chest radiographs dated 10/24/2017 and 02/15/2017 FINDINGS: The heart size and mediastinal contours are within normal limits. The lungs are clear. No pneumothorax or pleural effusion. Multilevel degenerative changes in the thoracic spine. IMPRESSION: No active cardiopulmonary disease. Electronically  Signed   By: Audie Pinto M.D.   On: 11/08/2018 16:09     Assessment & Plan:   Mild intermittent asthma - Seasonal symptoms - Resume Symbicort 160 two puffs twice daily  - Resume Flonase nasal spray  - Resume Omeprazole 64m  Daily  - CXR 11/08/2018 clear  - If cough recommendation not effective can precede with additional prednisone taper from PCP - FU  in 2 months  Recommendations: Voice rest for 2-3 days, avoid talking/laughing or clearing your throat Use delsym cough syrup twice daily Chlorphenamine - take 1 tablet every 4-6 hours (do not exceed more than 6 tabs in 24 hours) Sugar free lozengers Avoid mint or menthol  If above not effective, proceed with Prednisone taper     Martyn Ehrich, NP 11/11/2018

## 2018-11-08 NOTE — Patient Instructions (Addendum)
  Continue: Symbicort - take two puffs twice daily  Flonase - 1-2 puffs per nostril daily  Omeprazole 40mg  - take 30 mins before first meal of the day   Recommendations: Voice rest for 2-3 days, avoid talking/laughing or clearing your throat Use delsym cough syrup twice daily STOP zyrtec (dont take with chlor-tabs) Chlorphenamine - take 1 tablet every 4-6 hours (do not exceed more than 6 tabs in 24 hours) Sugar free lozengers Avoid mint or menthol  If above not effective, proceed with Prednisone taper   Orders:  CXR today   Follow-up: Dr. Valeta Harms in 2 months OR sooner if no improvement

## 2018-11-08 NOTE — Progress Notes (Signed)
Please let patient know CXR looked normal. Thoracic spine has some degenerative changes, follow up with PCP

## 2018-11-11 ENCOUNTER — Encounter: Payer: Self-pay | Admitting: Primary Care

## 2018-11-11 DIAGNOSIS — J4520 Mild intermittent asthma, uncomplicated: Secondary | ICD-10-CM | POA: Insufficient documentation

## 2018-11-11 NOTE — Assessment & Plan Note (Signed)
-   Seasonal symptoms - Resume Symbicort 160 two puffs twice daily  - Resume Flonase nasal spray  - Resume Omeprazole 40mg   Daily  - CXR 11/08/2018 clear  - If cough recommendation not effective can precede with additional prednisone taper from PCP - FU in 2 months  Recommendations: Voice rest for 2-3 days, avoid talking/laughing or clearing your throat Use delsym cough syrup twice daily Chlorphenamine - take 1 tablet every 4-6 hours (do not exceed more than 6 tabs in 24 hours) Sugar free lozengers Avoid mint or menthol  If above not effective, proceed with Prednisone taper

## 2018-11-12 NOTE — Progress Notes (Signed)
PCCM: Thanks for seeing her Garner Nash, DO [facility] 11/12/2018 11:16 AM

## 2018-12-12 DIAGNOSIS — L728 Other follicular cysts of the skin and subcutaneous tissue: Secondary | ICD-10-CM | POA: Diagnosis not present

## 2018-12-12 DIAGNOSIS — L309 Dermatitis, unspecified: Secondary | ICD-10-CM | POA: Diagnosis not present

## 2018-12-17 DIAGNOSIS — M4184 Other forms of scoliosis, thoracic region: Secondary | ICD-10-CM | POA: Diagnosis not present

## 2018-12-17 DIAGNOSIS — M5134 Other intervertebral disc degeneration, thoracic region: Secondary | ICD-10-CM | POA: Diagnosis not present

## 2018-12-17 DIAGNOSIS — M9901 Segmental and somatic dysfunction of cervical region: Secondary | ICD-10-CM | POA: Diagnosis not present

## 2018-12-17 DIAGNOSIS — M9903 Segmental and somatic dysfunction of lumbar region: Secondary | ICD-10-CM | POA: Diagnosis not present

## 2018-12-19 DIAGNOSIS — M25511 Pain in right shoulder: Secondary | ICD-10-CM | POA: Diagnosis not present

## 2018-12-19 DIAGNOSIS — M9901 Segmental and somatic dysfunction of cervical region: Secondary | ICD-10-CM | POA: Diagnosis not present

## 2018-12-20 DIAGNOSIS — S52691A Other fracture of lower end of right ulna, initial encounter for closed fracture: Secondary | ICD-10-CM | POA: Diagnosis not present

## 2018-12-20 DIAGNOSIS — S52501A Unspecified fracture of the lower end of right radius, initial encounter for closed fracture: Secondary | ICD-10-CM | POA: Diagnosis not present

## 2018-12-20 DIAGNOSIS — I10 Essential (primary) hypertension: Secondary | ICD-10-CM | POA: Diagnosis not present

## 2018-12-20 DIAGNOSIS — S92351A Displaced fracture of fifth metatarsal bone, right foot, initial encounter for closed fracture: Secondary | ICD-10-CM | POA: Diagnosis not present

## 2018-12-20 DIAGNOSIS — S52591A Other fractures of lower end of right radius, initial encounter for closed fracture: Secondary | ICD-10-CM | POA: Diagnosis not present

## 2018-12-20 DIAGNOSIS — M25572 Pain in left ankle and joints of left foot: Secondary | ICD-10-CM | POA: Diagnosis not present

## 2018-12-20 DIAGNOSIS — S92342A Displaced fracture of fourth metatarsal bone, left foot, initial encounter for closed fracture: Secondary | ICD-10-CM | POA: Diagnosis not present

## 2018-12-20 DIAGNOSIS — S92352A Displaced fracture of fifth metatarsal bone, left foot, initial encounter for closed fracture: Secondary | ICD-10-CM | POA: Diagnosis not present

## 2018-12-20 DIAGNOSIS — M25531 Pain in right wrist: Secondary | ICD-10-CM | POA: Diagnosis not present

## 2018-12-20 DIAGNOSIS — R079 Chest pain, unspecified: Secondary | ICD-10-CM | POA: Diagnosis not present

## 2018-12-20 DIAGNOSIS — S52601A Unspecified fracture of lower end of right ulna, initial encounter for closed fracture: Secondary | ICD-10-CM | POA: Diagnosis not present

## 2018-12-26 DIAGNOSIS — S52571A Other intraarticular fracture of lower end of right radius, initial encounter for closed fracture: Secondary | ICD-10-CM | POA: Diagnosis not present

## 2018-12-27 DIAGNOSIS — Y999 Unspecified external cause status: Secondary | ICD-10-CM | POA: Diagnosis not present

## 2018-12-27 DIAGNOSIS — S52571A Other intraarticular fracture of lower end of right radius, initial encounter for closed fracture: Secondary | ICD-10-CM | POA: Diagnosis not present

## 2018-12-27 DIAGNOSIS — X58XXXA Exposure to other specified factors, initial encounter: Secondary | ICD-10-CM | POA: Diagnosis not present

## 2018-12-30 DIAGNOSIS — E041 Nontoxic single thyroid nodule: Secondary | ICD-10-CM | POA: Diagnosis not present

## 2018-12-30 DIAGNOSIS — R1011 Right upper quadrant pain: Secondary | ICD-10-CM | POA: Diagnosis not present

## 2018-12-30 DIAGNOSIS — Z01419 Encounter for gynecological examination (general) (routine) without abnormal findings: Secondary | ICD-10-CM | POA: Diagnosis not present

## 2018-12-30 DIAGNOSIS — Z6823 Body mass index (BMI) 23.0-23.9, adult: Secondary | ICD-10-CM | POA: Diagnosis not present

## 2018-12-31 DIAGNOSIS — S52571D Other intraarticular fracture of lower end of right radius, subsequent encounter for closed fracture with routine healing: Secondary | ICD-10-CM | POA: Diagnosis not present

## 2018-12-31 DIAGNOSIS — M25531 Pain in right wrist: Secondary | ICD-10-CM | POA: Diagnosis not present

## 2018-12-31 DIAGNOSIS — S52571A Other intraarticular fracture of lower end of right radius, initial encounter for closed fracture: Secondary | ICD-10-CM | POA: Diagnosis not present

## 2018-12-31 DIAGNOSIS — M25631 Stiffness of right wrist, not elsewhere classified: Secondary | ICD-10-CM | POA: Diagnosis not present

## 2019-01-02 ENCOUNTER — Other Ambulatory Visit: Payer: Self-pay | Admitting: Obstetrics and Gynecology

## 2019-01-02 DIAGNOSIS — E041 Nontoxic single thyroid nodule: Secondary | ICD-10-CM

## 2019-01-02 DIAGNOSIS — M25511 Pain in right shoulder: Secondary | ICD-10-CM | POA: Diagnosis not present

## 2019-01-02 DIAGNOSIS — M9901 Segmental and somatic dysfunction of cervical region: Secondary | ICD-10-CM | POA: Diagnosis not present

## 2019-01-08 ENCOUNTER — Other Ambulatory Visit (HOSPITAL_COMMUNITY): Payer: Self-pay | Admitting: Obstetrics and Gynecology

## 2019-01-08 DIAGNOSIS — R011 Cardiac murmur, unspecified: Secondary | ICD-10-CM

## 2019-01-14 DIAGNOSIS — M9901 Segmental and somatic dysfunction of cervical region: Secondary | ICD-10-CM | POA: Diagnosis not present

## 2019-01-14 DIAGNOSIS — M25511 Pain in right shoulder: Secondary | ICD-10-CM | POA: Diagnosis not present

## 2019-01-20 ENCOUNTER — Other Ambulatory Visit: Payer: BC Managed Care – PPO

## 2019-01-20 DIAGNOSIS — M9905 Segmental and somatic dysfunction of pelvic region: Secondary | ICD-10-CM | POA: Diagnosis not present

## 2019-01-20 DIAGNOSIS — M5134 Other intervertebral disc degeneration, thoracic region: Secondary | ICD-10-CM | POA: Diagnosis not present

## 2019-01-20 DIAGNOSIS — M9903 Segmental and somatic dysfunction of lumbar region: Secondary | ICD-10-CM | POA: Diagnosis not present

## 2019-01-20 DIAGNOSIS — M4184 Other forms of scoliosis, thoracic region: Secondary | ICD-10-CM | POA: Diagnosis not present

## 2019-01-21 DIAGNOSIS — S52571A Other intraarticular fracture of lower end of right radius, initial encounter for closed fracture: Secondary | ICD-10-CM | POA: Diagnosis not present

## 2019-01-22 DIAGNOSIS — M4184 Other forms of scoliosis, thoracic region: Secondary | ICD-10-CM | POA: Diagnosis not present

## 2019-01-22 DIAGNOSIS — M5134 Other intervertebral disc degeneration, thoracic region: Secondary | ICD-10-CM | POA: Diagnosis not present

## 2019-01-22 DIAGNOSIS — M9905 Segmental and somatic dysfunction of pelvic region: Secondary | ICD-10-CM | POA: Diagnosis not present

## 2019-01-22 DIAGNOSIS — M9903 Segmental and somatic dysfunction of lumbar region: Secondary | ICD-10-CM | POA: Diagnosis not present

## 2019-01-30 DIAGNOSIS — M25511 Pain in right shoulder: Secondary | ICD-10-CM | POA: Diagnosis not present

## 2019-01-30 DIAGNOSIS — M9901 Segmental and somatic dysfunction of cervical region: Secondary | ICD-10-CM | POA: Diagnosis not present

## 2019-02-03 DIAGNOSIS — M25511 Pain in right shoulder: Secondary | ICD-10-CM | POA: Diagnosis not present

## 2019-02-03 DIAGNOSIS — M9901 Segmental and somatic dysfunction of cervical region: Secondary | ICD-10-CM | POA: Diagnosis not present

## 2019-02-04 ENCOUNTER — Other Ambulatory Visit (HOSPITAL_COMMUNITY): Payer: BC Managed Care – PPO

## 2019-02-07 DIAGNOSIS — Z23 Encounter for immunization: Secondary | ICD-10-CM | POA: Diagnosis not present

## 2019-02-11 DIAGNOSIS — S52571A Other intraarticular fracture of lower end of right radius, initial encounter for closed fracture: Secondary | ICD-10-CM | POA: Diagnosis not present

## 2019-02-13 ENCOUNTER — Ambulatory Visit (INDEPENDENT_AMBULATORY_CARE_PROVIDER_SITE_OTHER): Payer: BC Managed Care – PPO | Admitting: Psychiatry

## 2019-02-13 ENCOUNTER — Other Ambulatory Visit: Payer: Self-pay

## 2019-02-13 ENCOUNTER — Encounter: Payer: Self-pay | Admitting: Psychiatry

## 2019-02-13 DIAGNOSIS — F4321 Adjustment disorder with depressed mood: Secondary | ICD-10-CM | POA: Diagnosis not present

## 2019-02-13 NOTE — Progress Notes (Signed)
      [facility] Counselor/Therapist Progress Note  Patient ID: Jennifer Evans, MRN: [id number],    Date: 02/13/2019  Time Spent: 50 minutes   Treatment Type: Individual Therapy  Reported Symptoms: irritable, sad  Mental Status Exam:  Appearance:   Well Groomed     Behavior:  Appropriate  Motor:  Normal  Speech/Language:   Clear and Coherent  Affect:  Appropriate  Mood:  irritable and sad  Thought process:  normal  Thought content:    WNL  Sensory/Perceptual disturbances:    WNL  Orientation:  oriented to person, place, time/date and situation  Attention:  Good  Concentration:  Good  Memory:  WNL  Fund of knowledge:   Good  Insight:    Good  Judgment:   Good  Impulse Control:  Good   Risk Assessment: Danger to Self:  No Self-injurious Behavior: No Danger to Others: No Duty to Warn:no Physical Aggression / Violence:No  Access to Firearms a concern: No  Gang Involvement:No   Subjective: Client states that she has been married for 26 years and both of her children are now out of the house.  Since they are empty nesters they have been much more time together.  Her husband is her polar opposite when it comes to personality.  She describes his personality as flat.  On the Marcella Dubs type indicator she is then [facility] and he is a Proofreader.  She is very extroverted and easily engages people.  He is an introvert and tends to stay by himself.  She realizes that she needs more from him but feels that he is uninterested.  "I married him being skeptical."  She states that she is not in love but she does love him.  "I want more connection."  His love language is acts of service and gifts which he apparently does great at.  Hers is quality time.  She describes him as very much a perfectionist.  "My children do not want me to pick on him."  She feels that his pride gets in the way for him. I Discussed with the client that her main goal was to get more connection with her husband.  She states  that his job as a Patent examiner required him to do some public speaking.  She stated that he writes very well and is very expressive in that.  I asked the client to consider writing her husband a letter.  In that letter explained to him his good qualities and then her desire to be more connected to him.  Give him 2 or 3 topics to write back to her about.  After she reads and then maybe they could get together and he could read his letter to her so she could hear it in his own voice.  My hope would be that it would spark a conversation.  I also explained to the client that her husband just needs to acquire some skills about social conversation including the difference between opened and closed ended questions.  The client agreed.  Interventions: Assertiveness/Communication, Motivational Interviewing, Solution-Oriented/Positive Psychology and Insight-Oriented  Diagnosis:   ICD-10-CM   1. Adjustment disorder with depressed mood  F43.21     Plan: Letter to husband, assertiveness, self-care, positive self talk.  Iyesha Such, [facility]

## 2019-02-17 DIAGNOSIS — M25631 Stiffness of right wrist, not elsewhere classified: Secondary | ICD-10-CM | POA: Diagnosis not present

## 2019-02-17 DIAGNOSIS — S52571D Other intraarticular fracture of lower end of right radius, subsequent encounter for closed fracture with routine healing: Secondary | ICD-10-CM | POA: Diagnosis not present

## 2019-02-17 DIAGNOSIS — M25531 Pain in right wrist: Secondary | ICD-10-CM | POA: Diagnosis not present

## 2019-02-24 ENCOUNTER — Other Ambulatory Visit: Payer: Self-pay

## 2019-02-24 ENCOUNTER — Ambulatory Visit (HOSPITAL_COMMUNITY): Payer: BC Managed Care – PPO | Attending: Cardiology

## 2019-02-24 DIAGNOSIS — R011 Cardiac murmur, unspecified: Secondary | ICD-10-CM | POA: Diagnosis not present

## 2019-02-25 DIAGNOSIS — S52571D Other intraarticular fracture of lower end of right radius, subsequent encounter for closed fracture with routine healing: Secondary | ICD-10-CM | POA: Diagnosis not present

## 2019-02-25 DIAGNOSIS — M25631 Stiffness of right wrist, not elsewhere classified: Secondary | ICD-10-CM | POA: Diagnosis not present

## 2019-02-25 DIAGNOSIS — M25531 Pain in right wrist: Secondary | ICD-10-CM | POA: Diagnosis not present

## 2019-02-26 DIAGNOSIS — M9901 Segmental and somatic dysfunction of cervical region: Secondary | ICD-10-CM | POA: Diagnosis not present

## 2019-02-26 DIAGNOSIS — M25511 Pain in right shoulder: Secondary | ICD-10-CM | POA: Diagnosis not present

## 2019-02-27 DIAGNOSIS — M25531 Pain in right wrist: Secondary | ICD-10-CM | POA: Diagnosis not present

## 2019-02-27 DIAGNOSIS — M25631 Stiffness of right wrist, not elsewhere classified: Secondary | ICD-10-CM | POA: Diagnosis not present

## 2019-02-27 DIAGNOSIS — S52571D Other intraarticular fracture of lower end of right radius, subsequent encounter for closed fracture with routine healing: Secondary | ICD-10-CM | POA: Diagnosis not present

## 2019-03-03 DIAGNOSIS — M25531 Pain in right wrist: Secondary | ICD-10-CM | POA: Diagnosis not present

## 2019-03-03 DIAGNOSIS — S52571D Other intraarticular fracture of lower end of right radius, subsequent encounter for closed fracture with routine healing: Secondary | ICD-10-CM | POA: Diagnosis not present

## 2019-03-03 DIAGNOSIS — M25631 Stiffness of right wrist, not elsewhere classified: Secondary | ICD-10-CM | POA: Diagnosis not present

## 2019-03-05 DIAGNOSIS — S52571D Other intraarticular fracture of lower end of right radius, subsequent encounter for closed fracture with routine healing: Secondary | ICD-10-CM | POA: Diagnosis not present

## 2019-03-05 DIAGNOSIS — M25631 Stiffness of right wrist, not elsewhere classified: Secondary | ICD-10-CM | POA: Diagnosis not present

## 2019-03-05 DIAGNOSIS — M25531 Pain in right wrist: Secondary | ICD-10-CM | POA: Diagnosis not present

## 2019-03-11 DIAGNOSIS — M25631 Stiffness of right wrist, not elsewhere classified: Secondary | ICD-10-CM | POA: Diagnosis not present

## 2019-03-11 DIAGNOSIS — S52571D Other intraarticular fracture of lower end of right radius, subsequent encounter for closed fracture with routine healing: Secondary | ICD-10-CM | POA: Diagnosis not present

## 2019-03-11 DIAGNOSIS — M25531 Pain in right wrist: Secondary | ICD-10-CM | POA: Diagnosis not present

## 2019-03-13 DIAGNOSIS — S52571D Other intraarticular fracture of lower end of right radius, subsequent encounter for closed fracture with routine healing: Secondary | ICD-10-CM | POA: Diagnosis not present

## 2019-03-13 DIAGNOSIS — M25531 Pain in right wrist: Secondary | ICD-10-CM | POA: Diagnosis not present

## 2019-03-13 DIAGNOSIS — M25631 Stiffness of right wrist, not elsewhere classified: Secondary | ICD-10-CM | POA: Diagnosis not present

## 2019-03-18 DIAGNOSIS — S52571D Other intraarticular fracture of lower end of right radius, subsequent encounter for closed fracture with routine healing: Secondary | ICD-10-CM | POA: Diagnosis not present

## 2019-03-18 DIAGNOSIS — M25631 Stiffness of right wrist, not elsewhere classified: Secondary | ICD-10-CM | POA: Diagnosis not present

## 2019-03-18 DIAGNOSIS — R29898 Other symptoms and signs involving the musculoskeletal system: Secondary | ICD-10-CM | POA: Diagnosis not present

## 2019-03-18 DIAGNOSIS — M25531 Pain in right wrist: Secondary | ICD-10-CM | POA: Diagnosis not present

## 2019-03-18 DIAGNOSIS — S52571A Other intraarticular fracture of lower end of right radius, initial encounter for closed fracture: Secondary | ICD-10-CM | POA: Diagnosis not present

## 2019-03-21 DIAGNOSIS — S52571D Other intraarticular fracture of lower end of right radius, subsequent encounter for closed fracture with routine healing: Secondary | ICD-10-CM | POA: Diagnosis not present

## 2019-03-21 DIAGNOSIS — R29898 Other symptoms and signs involving the musculoskeletal system: Secondary | ICD-10-CM | POA: Diagnosis not present

## 2019-03-25 DIAGNOSIS — S52571D Other intraarticular fracture of lower end of right radius, subsequent encounter for closed fracture with routine healing: Secondary | ICD-10-CM | POA: Diagnosis not present

## 2019-03-25 DIAGNOSIS — R29898 Other symptoms and signs involving the musculoskeletal system: Secondary | ICD-10-CM | POA: Diagnosis not present

## 2019-03-27 DIAGNOSIS — R29898 Other symptoms and signs involving the musculoskeletal system: Secondary | ICD-10-CM | POA: Diagnosis not present

## 2019-03-27 DIAGNOSIS — S52571D Other intraarticular fracture of lower end of right radius, subsequent encounter for closed fracture with routine healing: Secondary | ICD-10-CM | POA: Diagnosis not present

## 2019-04-09 DIAGNOSIS — M25631 Stiffness of right wrist, not elsewhere classified: Secondary | ICD-10-CM | POA: Diagnosis not present

## 2019-04-09 DIAGNOSIS — R29898 Other symptoms and signs involving the musculoskeletal system: Secondary | ICD-10-CM | POA: Diagnosis not present

## 2019-04-09 DIAGNOSIS — M25531 Pain in right wrist: Secondary | ICD-10-CM | POA: Diagnosis not present

## 2019-04-09 DIAGNOSIS — S52571D Other intraarticular fracture of lower end of right radius, subsequent encounter for closed fracture with routine healing: Secondary | ICD-10-CM | POA: Diagnosis not present

## 2019-04-15 DIAGNOSIS — S52571D Other intraarticular fracture of lower end of right radius, subsequent encounter for closed fracture with routine healing: Secondary | ICD-10-CM | POA: Diagnosis not present

## 2019-04-15 DIAGNOSIS — M25531 Pain in right wrist: Secondary | ICD-10-CM | POA: Diagnosis not present

## 2019-04-15 DIAGNOSIS — M25631 Stiffness of right wrist, not elsewhere classified: Secondary | ICD-10-CM | POA: Diagnosis not present

## 2019-04-15 DIAGNOSIS — R29898 Other symptoms and signs involving the musculoskeletal system: Secondary | ICD-10-CM | POA: Diagnosis not present

## 2019-04-17 DIAGNOSIS — M9901 Segmental and somatic dysfunction of cervical region: Secondary | ICD-10-CM | POA: Diagnosis not present

## 2019-04-17 DIAGNOSIS — M25511 Pain in right shoulder: Secondary | ICD-10-CM | POA: Diagnosis not present

## 2019-04-18 DIAGNOSIS — R29898 Other symptoms and signs involving the musculoskeletal system: Secondary | ICD-10-CM | POA: Diagnosis not present

## 2019-04-18 DIAGNOSIS — M25531 Pain in right wrist: Secondary | ICD-10-CM | POA: Diagnosis not present

## 2019-04-18 DIAGNOSIS — S52571D Other intraarticular fracture of lower end of right radius, subsequent encounter for closed fracture with routine healing: Secondary | ICD-10-CM | POA: Diagnosis not present

## 2019-04-18 DIAGNOSIS — M25631 Stiffness of right wrist, not elsewhere classified: Secondary | ICD-10-CM | POA: Diagnosis not present

## 2019-04-21 ENCOUNTER — Other Ambulatory Visit: Payer: BC Managed Care – PPO

## 2019-04-25 DIAGNOSIS — S52571D Other intraarticular fracture of lower end of right radius, subsequent encounter for closed fracture with routine healing: Secondary | ICD-10-CM | POA: Diagnosis not present

## 2019-04-25 DIAGNOSIS — M25631 Stiffness of right wrist, not elsewhere classified: Secondary | ICD-10-CM | POA: Diagnosis not present

## 2019-04-25 DIAGNOSIS — R29898 Other symptoms and signs involving the musculoskeletal system: Secondary | ICD-10-CM | POA: Diagnosis not present

## 2019-04-25 DIAGNOSIS — M25531 Pain in right wrist: Secondary | ICD-10-CM | POA: Diagnosis not present

## 2019-05-02 DIAGNOSIS — S52571D Other intraarticular fracture of lower end of right radius, subsequent encounter for closed fracture with routine healing: Secondary | ICD-10-CM | POA: Diagnosis not present

## 2019-05-02 DIAGNOSIS — R29898 Other symptoms and signs involving the musculoskeletal system: Secondary | ICD-10-CM | POA: Diagnosis not present

## 2019-05-02 DIAGNOSIS — M25631 Stiffness of right wrist, not elsewhere classified: Secondary | ICD-10-CM | POA: Diagnosis not present

## 2019-05-13 DIAGNOSIS — M25511 Pain in right shoulder: Secondary | ICD-10-CM | POA: Diagnosis not present

## 2019-05-13 DIAGNOSIS — M9901 Segmental and somatic dysfunction of cervical region: Secondary | ICD-10-CM | POA: Diagnosis not present

## 2019-05-15 DIAGNOSIS — R29898 Other symptoms and signs involving the musculoskeletal system: Secondary | ICD-10-CM | POA: Diagnosis not present

## 2019-05-15 DIAGNOSIS — S52571D Other intraarticular fracture of lower end of right radius, subsequent encounter for closed fracture with routine healing: Secondary | ICD-10-CM | POA: Diagnosis not present

## 2019-05-23 DIAGNOSIS — H938X9 Other specified disorders of ear, unspecified ear: Secondary | ICD-10-CM | POA: Diagnosis not present

## 2019-05-23 DIAGNOSIS — H6690 Otitis media, unspecified, unspecified ear: Secondary | ICD-10-CM | POA: Diagnosis not present

## 2019-06-03 DIAGNOSIS — S52571D Other intraarticular fracture of lower end of right radius, subsequent encounter for closed fracture with routine healing: Secondary | ICD-10-CM | POA: Diagnosis not present

## 2019-06-03 DIAGNOSIS — R29898 Other symptoms and signs involving the musculoskeletal system: Secondary | ICD-10-CM | POA: Diagnosis not present

## 2019-06-04 DIAGNOSIS — M9901 Segmental and somatic dysfunction of cervical region: Secondary | ICD-10-CM | POA: Diagnosis not present

## 2019-06-04 DIAGNOSIS — M25511 Pain in right shoulder: Secondary | ICD-10-CM | POA: Diagnosis not present

## 2019-06-11 DIAGNOSIS — M9901 Segmental and somatic dysfunction of cervical region: Secondary | ICD-10-CM | POA: Diagnosis not present

## 2019-06-11 DIAGNOSIS — M25511 Pain in right shoulder: Secondary | ICD-10-CM | POA: Diagnosis not present

## 2019-06-16 ENCOUNTER — Other Ambulatory Visit: Payer: BC Managed Care – PPO

## 2019-06-17 DIAGNOSIS — M25511 Pain in right shoulder: Secondary | ICD-10-CM | POA: Diagnosis not present

## 2019-06-17 DIAGNOSIS — M9901 Segmental and somatic dysfunction of cervical region: Secondary | ICD-10-CM | POA: Diagnosis not present

## 2019-06-19 DIAGNOSIS — M8580 Other specified disorders of bone density and structure, unspecified site: Secondary | ICD-10-CM | POA: Diagnosis not present

## 2019-06-19 DIAGNOSIS — F419 Anxiety disorder, unspecified: Secondary | ICD-10-CM | POA: Diagnosis not present

## 2019-06-20 DIAGNOSIS — R29898 Other symptoms and signs involving the musculoskeletal system: Secondary | ICD-10-CM | POA: Diagnosis not present

## 2019-07-02 ENCOUNTER — Encounter: Payer: Self-pay | Admitting: Gastroenterology

## 2019-07-18 ENCOUNTER — Encounter: Payer: Self-pay | Admitting: *Deleted

## 2019-07-21 ENCOUNTER — Ambulatory Visit: Payer: BC Managed Care – PPO | Admitting: Gastroenterology

## 2019-07-21 ENCOUNTER — Encounter: Payer: Self-pay | Admitting: Gastroenterology

## 2019-07-21 VITALS — BP 128/82 | HR 92 | Temp 97.5°F | Ht 64.0 in | Wt 135.5 lb

## 2019-07-21 DIAGNOSIS — R151 Fecal smearing: Secondary | ICD-10-CM

## 2019-07-21 NOTE — Patient Instructions (Signed)
If you are age 59 or older, your body mass index should be between 23-30. Your Body mass index is 23.26 kg/m. If this is out of the aforementioned range listed, please consider follow up with your Primary Care Provider.  If you are age 68 or younger, your body mass index should be between 19-25. Your Body mass index is 23.26 kg/m. If this is out of the aformentioned range listed, please consider follow up with your Primary Care Provider.   Benefiber 2 tablespoons in 12-16 oz of water daily.   Call back in 6-8 weeks with update as for [employer].  Colonoscopy due in September 2021.

## 2019-07-21 NOTE — Progress Notes (Signed)
I agree with the above note, plan 

## 2019-07-21 NOTE — Progress Notes (Signed)
07/21/2019 Jennifer Evans [id number] November 05, 1960   HISTORY OF PRESENT ILLNESS: This is a pleasant 59 year old female who is a patient of Dr. Ardis Hughs, known to him only for colonoscopy in September 2011 at which time the study was normal.  Today she presents to our office with complaints of intermittent leakage of small amounts of stool.  She says that for the past year or so she notices that a couple of times per month after she has a bowel movement if it is particularly a softer or looser type stool that later when she returns to the restroom she will notice a small amount of liquid/semisolid stool in her underpants.  She says that overall she moves her bowels regularly without issues.  She says an occasional episode of diarrhea if she eats something bothers her stomach, but nothing that would have prompted evaluation.  Denies rectal bleeding and abdominal pain.  Says that she feels well.   Referred here by GYN, Dr. Radene Knee for leakage of stools/loose stool.  Past Medical History:  Diagnosis Date  . Arthritis   . Contact lens/glasses fitting   . Eczema   . Family history of breast cancer   . Hypertension   . Kidney stones   . Menopause   . Nasal congestion   . 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 Sinus headache   . Thyroid disease    hx of thyroid nodule (stable)   Past Surgical History:  Procedure Laterality Date  . APPENDECTOMY  2007  . benign thyroid nodule removed  1999  . BREAST EXCISIONAL BIOPSY Right   . COLONOSCOPY  12/2009    reports that she has never smoked. She has never used smokeless tobacco. She reports current alcohol use. She reports that she does not use drugs. family history includes Breast cancer (age of onset: 35) in her mother; Cancer in her maternal uncle; Cancer (age of onset: 55) in her mother; Dementia (age of onset: 67) in her father; Stroke (age of onset: 68) in her father. Allergies  Allergen Reactions  . Ketamine Other (See  Comments)      Outpatient Encounter Medications as of 07/21/2019  Medication Sig  . budesonide-formoterol (SYMBICORT) 160-4.5 MCG/ACT inhaler Inhale 2 puffs into the lungs 2 (two) times daily.  Marland Kitchen escitalopram (LEXAPRO) 10 MG tablet Take 10 mg by mouth daily.  . fluticasone (FLONASE) 50 MCG/ACT nasal spray daily as needed.  . hydrochlorothiazide (HYDRODIURIL) 12.5 MG tablet Take 12.5 mg by mouth daily.    Marland Kitchen losartan (COZAAR) 25 MG tablet Take 25 mg by mouth daily.  . montelukast (SINGULAIR) 10 MG tablet Take 10 mg by mouth daily.  . Multiple Vitamins-Calcium (ONE-A-DAY WOMENS PO) Take by mouth. With calcium in it.   . pantoprazole (PROTONIX) 40 MG tablet Take 1 tablet by mouth daily.  Marland Kitchen Spacer/Aero-Holding Chambers (AEROCHAMBER MINI CHAMBER) DEVI 1 Device by Does not apply route as directed.  . [DISCONTINUED] budesonide-formoterol (SYMBICORT) 160-4.5 MCG/ACT inhaler Inhale 2 puffs into the lungs every 12 (twelve) hours. (Patient not taking: Reported on 07/21/2019)  . [DISCONTINUED] predniSONE (DELTASONE) 20 MG tablet Take 1 tablet by mouth. 9 day taper - but not started yet by patient as of 11/08/18   No facility-administered encounter medications on file as of 07/21/2019.     REVIEW OF SYSTEMS  : All other systems reviewed and negative except where noted in the History of Present Illness.   PHYSICAL EXAM: BP 128/82 (BP Location:  Left Arm, Patient Position: Sitting, Cuff Size: Normal)   Pulse 92   Temp (!) 97.5 F (36.4 C)   Ht 5\' 4"  (1.626 m)   Wt 135 lb 8 oz (61.5 kg)   SpO2 98%   BMI 23.26 kg/m  General: Well developed white female in no acute distress Head: Normocephalic and atraumatic Eyes:  Sclerae anicteric, conjunctiva pink. Ears: Normal auditory acuity Lungs: Clear throughout to auscultation; no increased WOB. Heart: Regular rate and rhythm; no M/R/G. Abdomen: Soft, non-distended.  BS present.  Non-tender. Rectal:  No external abnormalities noted.  Small amount of liquid  brown stool surrounding the anus.  DRE revealed soft stool in the rectum.  Mildly decreased resting sphincter tone and squeezing. Musculoskeletal: Symmetrical with no gross deformities  Skin: No lesions on visible extremities Extremities: No edema  Neurological: Alert oriented x 4, grossly non-focal Psychological:  Alert and cooperative. Normal mood and affect  ASSESSMENT AND PLAN: *Intermittent leakage of soft stool likely due to decreased sphincter tone as seen on exam today.  Will try Benefiber 2 Tbsp daily to try to bulk the stool and try to evacuate better so that liquid/loose stool does not remain in rectum.  If no improvement after several weeks then could refer to pelvic floor PT.  Is due for screening colonoscopy in September of this year.   CC:  Hulan Fess, MD  CC:  Dr. Radene Knee

## 2019-08-04 DIAGNOSIS — M9903 Segmental and somatic dysfunction of lumbar region: Secondary | ICD-10-CM | POA: Diagnosis not present

## 2019-08-04 DIAGNOSIS — M5134 Other intervertebral disc degeneration, thoracic region: Secondary | ICD-10-CM | POA: Diagnosis not present

## 2019-08-04 DIAGNOSIS — M9905 Segmental and somatic dysfunction of pelvic region: Secondary | ICD-10-CM | POA: Diagnosis not present

## 2019-08-04 DIAGNOSIS — M4184 Other forms of scoliosis, thoracic region: Secondary | ICD-10-CM | POA: Diagnosis not present

## 2019-08-12 DIAGNOSIS — M4184 Other forms of scoliosis, thoracic region: Secondary | ICD-10-CM | POA: Diagnosis not present

## 2019-08-12 DIAGNOSIS — M5134 Other intervertebral disc degeneration, thoracic region: Secondary | ICD-10-CM | POA: Diagnosis not present

## 2019-08-12 DIAGNOSIS — M9903 Segmental and somatic dysfunction of lumbar region: Secondary | ICD-10-CM | POA: Diagnosis not present

## 2019-08-12 DIAGNOSIS — M9905 Segmental and somatic dysfunction of pelvic region: Secondary | ICD-10-CM | POA: Diagnosis not present

## 2019-08-18 ENCOUNTER — Ambulatory Visit
Admission: RE | Admit: 2019-08-18 | Discharge: 2019-08-18 | Disposition: A | Discharge status: Home / Self Care | Payer: BC Managed Care – PPO | Source: Ambulatory Visit | Attending: Obstetrics and Gynecology | Admitting: Obstetrics and Gynecology

## 2019-08-18 DIAGNOSIS — E041 Nontoxic single thyroid nodule: Secondary | ICD-10-CM

## 2019-08-19 DIAGNOSIS — M9903 Segmental and somatic dysfunction of lumbar region: Secondary | ICD-10-CM | POA: Diagnosis not present

## 2019-08-19 DIAGNOSIS — M9905 Segmental and somatic dysfunction of pelvic region: Secondary | ICD-10-CM | POA: Diagnosis not present

## 2019-08-19 DIAGNOSIS — M5134 Other intervertebral disc degeneration, thoracic region: Secondary | ICD-10-CM | POA: Diagnosis not present

## 2019-08-19 DIAGNOSIS — M4184 Other forms of scoliosis, thoracic region: Secondary | ICD-10-CM | POA: Diagnosis not present

## 2019-08-26 DIAGNOSIS — M9901 Segmental and somatic dysfunction of cervical region: Secondary | ICD-10-CM | POA: Diagnosis not present

## 2019-08-26 DIAGNOSIS — M25511 Pain in right shoulder: Secondary | ICD-10-CM | POA: Diagnosis not present

## 2019-08-28 DIAGNOSIS — M4184 Other forms of scoliosis, thoracic region: Secondary | ICD-10-CM | POA: Diagnosis not present

## 2019-08-28 DIAGNOSIS — M9903 Segmental and somatic dysfunction of lumbar region: Secondary | ICD-10-CM | POA: Diagnosis not present

## 2019-08-28 DIAGNOSIS — M5134 Other intervertebral disc degeneration, thoracic region: Secondary | ICD-10-CM | POA: Diagnosis not present

## 2019-08-28 DIAGNOSIS — M9905 Segmental and somatic dysfunction of pelvic region: Secondary | ICD-10-CM | POA: Diagnosis not present

## 2019-09-11 DIAGNOSIS — M9905 Segmental and somatic dysfunction of pelvic region: Secondary | ICD-10-CM | POA: Diagnosis not present

## 2019-09-11 DIAGNOSIS — M5134 Other intervertebral disc degeneration, thoracic region: Secondary | ICD-10-CM | POA: Diagnosis not present

## 2019-09-11 DIAGNOSIS — M9903 Segmental and somatic dysfunction of lumbar region: Secondary | ICD-10-CM | POA: Diagnosis not present

## 2019-09-11 DIAGNOSIS — M4184 Other forms of scoliosis, thoracic region: Secondary | ICD-10-CM | POA: Diagnosis not present

## 2019-09-18 DIAGNOSIS — M9905 Segmental and somatic dysfunction of pelvic region: Secondary | ICD-10-CM | POA: Diagnosis not present

## 2019-09-18 DIAGNOSIS — M5134 Other intervertebral disc degeneration, thoracic region: Secondary | ICD-10-CM | POA: Diagnosis not present

## 2019-09-18 DIAGNOSIS — M9903 Segmental and somatic dysfunction of lumbar region: Secondary | ICD-10-CM | POA: Diagnosis not present

## 2019-09-18 DIAGNOSIS — M4184 Other forms of scoliosis, thoracic region: Secondary | ICD-10-CM | POA: Diagnosis not present

## 2019-09-23 DIAGNOSIS — L988 Other specified disorders of the skin and subcutaneous tissue: Secondary | ICD-10-CM | POA: Diagnosis not present

## 2019-09-24 ENCOUNTER — Encounter: Payer: Self-pay | Admitting: Physician Assistant

## 2019-09-24 ENCOUNTER — Ambulatory Visit: Payer: BC Managed Care – PPO | Admitting: Physician Assistant

## 2019-09-24 ENCOUNTER — Other Ambulatory Visit: Payer: Self-pay

## 2019-09-24 DIAGNOSIS — L821 Other seborrheic keratosis: Secondary | ICD-10-CM

## 2019-09-24 DIAGNOSIS — L281 Prurigo nodularis: Secondary | ICD-10-CM | POA: Diagnosis not present

## 2019-09-24 MED ORDER — CORDRAN 4 MCG/SQCM EX TAPE: 1.0000 | MEDICATED_TAPE | Freq: Every day | CUTANEOUS | 1 refills | Status: DC

## 2019-09-24 NOTE — Progress Notes (Signed)
   Follow up Visit  Subjective  Jennifer Evans is a 59 y.o. female who presents for the following: Skin Problem (Patient here today for spot under left breast x 2 days.  Patient states that she say her GYN yesterday and they informed her that it's likely an AK.  Patient would also like her right arm looked at.). Noticed a new mole under the left breast and GYN thought it may be an AK.     Objective  Well appearing patient in no apparent distress; mood and affect are within normal limits.  A focused examination was performed including chest, arms, back.. Relevant physical exam findings are noted in the Assessment and Plan. No suspicious moles noted on back.   Objective  Right Upper Arm - Anterior: Three inflamed scabbed papules right arm  Objective  Left Inframammary Fold, Right Inframammary Fold: Stuck-on, waxy papules and plaques.   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 Patient does not want at this time.

## 2019-09-26 ENCOUNTER — Other Ambulatory Visit: Payer: Self-pay | Admitting: Obstetrics and Gynecology

## 2019-09-26 DIAGNOSIS — Z1231 Encounter for screening mammogram for malignant neoplasm of breast: Secondary | ICD-10-CM

## 2019-09-30 ENCOUNTER — Ambulatory Visit (INDEPENDENT_AMBULATORY_CARE_PROVIDER_SITE_OTHER): Payer: BC Managed Care – PPO

## 2019-09-30 ENCOUNTER — Other Ambulatory Visit: Payer: Self-pay

## 2019-09-30 ENCOUNTER — Ambulatory Visit: Payer: BC Managed Care – PPO | Admitting: Podiatry

## 2019-09-30 DIAGNOSIS — S90852A Superficial foreign body, left foot, initial encounter: Secondary | ICD-10-CM

## 2019-09-30 DIAGNOSIS — L989 Disorder of the skin and subcutaneous tissue, unspecified: Secondary | ICD-10-CM | POA: Diagnosis not present

## 2019-09-30 DIAGNOSIS — M79672 Pain in left foot: Secondary | ICD-10-CM

## 2019-09-30 DIAGNOSIS — M779 Enthesopathy, unspecified: Secondary | ICD-10-CM

## 2019-10-01 DIAGNOSIS — M9903 Segmental and somatic dysfunction of lumbar region: Secondary | ICD-10-CM | POA: Diagnosis not present

## 2019-10-01 DIAGNOSIS — M9905 Segmental and somatic dysfunction of pelvic region: Secondary | ICD-10-CM | POA: Diagnosis not present

## 2019-10-01 DIAGNOSIS — M4184 Other forms of scoliosis, thoracic region: Secondary | ICD-10-CM | POA: Diagnosis not present

## 2019-10-01 DIAGNOSIS — M5134 Other intervertebral disc degeneration, thoracic region: Secondary | ICD-10-CM | POA: Diagnosis not present

## 2019-10-03 ENCOUNTER — Other Ambulatory Visit: Payer: Self-pay | Admitting: Podiatry

## 2019-10-03 DIAGNOSIS — L989 Disorder of the skin and subcutaneous tissue, unspecified: Secondary | ICD-10-CM

## 2019-10-05 NOTE — Progress Notes (Signed)
Subjective:   Patient ID: Jennifer Evans, female   DOB: 59 y.o.   MRN: [id number]   HPI  58 year old female presents the office today for concerns of a possible "stone bruise" on the plantar aspect left foot just on the big toe area.  She states that on Saturday she thought she had stepped on something.  Areas become tender.  Is been somewhat better compared to what it has been.  She denies actually seeing foreign object or any bleeding.   Review of Systems  All other systems reviewed and are negative.  Past Medical History:  Diagnosis Date  . Arthritis   . Contact lens/glasses fitting   . Eczema   . Family history of breast cancer   . Hypertension   . Kidney stones   . Menopause   . Nasal congestion   . 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 Sinus headache   . Thyroid disease    hx of thyroid nodule (stable)    Past Surgical History:  Procedure Laterality Date  . APPENDECTOMY  2007  . benign thyroid nodule removed  1999  . BREAST EXCISIONAL BIOPSY Right   . COLONOSCOPY  12/2009     Current Outpatient Medications:  .  budesonide-formoterol (SYMBICORT) 160-4.5 MCG/ACT inhaler, Inhale 2 puffs into the lungs 2 (two) times daily., Disp: 1 Inhaler, Rfl: 0 .  Cyanocobalamin (B-12) 100 MCG TABS, B12, Disp: , Rfl:  .  escitalopram (LEXAPRO) 10 MG tablet, Take 10 mg by mouth daily., Disp: , Rfl:  .  Flurandrenolide (CORDRAN) 4 MCG/SQCM TAPE, Apply 1 each topically daily., Disp: 1 each, Rfl: 1 .  fluticasone (FLONASE) 50 MCG/ACT nasal spray, daily as needed., Disp: , Rfl:  .  hydrochlorothiazide (MICROZIDE) 12.5 MG capsule, Take 12.5 mg by mouth every morning., Disp: , Rfl:  .  losartan (COZAAR) 25 MG tablet, Take 25 mg by mouth daily., Disp: , Rfl:  .  montelukast (SINGULAIR) 10 MG tablet, Take 10 mg by mouth daily., Disp: , Rfl:  .  Multiple Vitamins-Calcium (ONE-A-DAY WOMENS PO), Take by mouth. With calcium in it. , Disp: , Rfl:  .  pantoprazole (PROTONIX) 40 MG  tablet, Take 1 tablet by mouth daily., Disp: , Rfl:  .  Spacer/Aero-Holding Chambers (AEROCHAMBER MINI CHAMBER) DEVI, 1 Device by Does not apply route as directed., Disp: 1 Device, Rfl: 0 .  valACYclovir (VALTREX) 1000 MG tablet, valacyclovir 1 gram tablet  TAKE 1 TABLET BY MOUTH EVERY DAY, Disp: , Rfl:   Allergies  Allergen Reactions  . Ketamine Other (See Comments)         Objective:  Physical Exam  General: AAO x3, NAD  Dermatological: Mild hyperkeratotic tissue plantar left foot just in the sulcus of the first toe plantarly.  There is no underlying ulceration.  There was a small speculum a dark area that I removed possibly a foreign body.  There was no puncture wound identified there is no drainage or open sores.  There is minimal edema.  No erythema or warmth.  Vascular: Dorsalis Pedis artery and Posterior Tibial artery pedal pulses are 2/4 bilateral with immedate capillary fill time. There is no pain with calf compression, swelling, warmth, erythema.   Neruologic: Grossly intact via light touch bilateral.   Musculoskeletal: Tenderness on the sulcus of the plantar first toe at the base.  Minimal edema.  No erythema or warmth.  Gait: Unassisted, Nonantalgic.      Assessment:   Possible foreign  body superficial left foot     Plan:  -Treatment options discussed including all alternatives, risks, and complications -Etiology of symptoms were discussed -I debrided the hyperkeratotic tissue to the area there was a small area of black tissue that I removed which may have been a very small foreign body.  Upon removal of this and debridement of the callus it appeared to have a nice, healthy, pink skin present.  Recommend Voltaren gel, ice to the area.  If no improvement will me know.    Trula Slade DPM

## 2019-10-14 ENCOUNTER — Ambulatory Visit (INDEPENDENT_AMBULATORY_CARE_PROVIDER_SITE_OTHER): Payer: Self-pay | Admitting: Psychiatry

## 2019-10-14 ENCOUNTER — Encounter: Payer: Self-pay | Admitting: Psychiatry

## 2019-10-14 ENCOUNTER — Other Ambulatory Visit: Payer: Self-pay

## 2019-10-14 DIAGNOSIS — F4321 Adjustment disorder with depressed mood: Secondary | ICD-10-CM

## 2019-10-14 NOTE — Progress Notes (Signed)
      [facility] Counselor/Therapist Progress Note  Patient ID: Jennifer Evans, MRN: [id number],    Date: 10/14/2019  Time Spent: 50 minutes   Treatment Type: Individual Therapy  Reported Symptoms: guilt, resentment  Mental Status Exam:  Appearance:   Well Groomed     Behavior:  Appropriate  Motor:  Normal  Speech/Language:   Clear and Coherent  Affect:  Appropriate  Mood:  irritable  Thought process:  normal  Thought content:    WNL  Sensory/Perceptual disturbances:    WNL  Orientation:  oriented to person, place, time/date and situation  Attention:  Good  Concentration:  Good  Memory:  WNL  Fund of knowledge:   Good  Insight:    Good  Judgment:   Good  Impulse Control:  Good   Risk Assessment: Danger to Self:  No Self-injurious Behavior: No Danger to Others: No Duty to Warn:no Physical Aggression / Violence:No  Access to Firearms a concern: No  Gang Involvement:No   Subjective: The client states that right after her last appointment her daughter became pregnant and then got married.  "It is been very busy since then."  The client had done the [facility] with her and her husband.  She states she is a 2 and he is a 1.  She states the Enneagram nails their personalities pretty closely.  It has helped the client understand that some of her husband's issues Michio Thier not have to do with a character flaw but more with the way he is built. Today the client was complaining that her husband is so flat with people and their own adult children.  She states he is very action oriented.  His love language is ask of service.  He is also a perfectionist.  She states, "I feel guilty over my children's relationship with her dad."  She believes that her choice to marry her husband has caused her the responsibility for the poor relationship he has with the children.  She wants to come to terms, some kind of resolution that she made the match she did.  She feels resentment, guilt and frustration.   She notes he has such an individual orientation and does not seem to take the client into account.  She had wanted the client to come here and see this therapist.  I referred the client's husband to Georgia Dom, LCSW. I explained the EMDR process to the client and how we can use that to reduce her resentment, guilt and frustration.  The client is very interested in this and agrees to pursue it at next session.  In the meantime I suggested to the client that she purchase a blank journal that she and her husband can write back and forth to each other.  She agreed to try that.  Interventions: Assertiveness/Communication, Motivational Interviewing, Solution-Oriented/Positive Psychology and Insight-Oriented  Diagnosis:   ICD-10-CM   1. Adjustment disorder with depressed mood  F43.21     Plan: Shared journal, boundaries, assertiveness, self-care, positive self talk.  Monterius Rolf, [facility]

## 2019-10-15 ENCOUNTER — Ambulatory Visit
Admission: RE | Admit: 2019-10-15 | Discharge: 2019-10-15 | Disposition: A | Discharge status: Home / Self Care | Payer: BC Managed Care – PPO | Source: Ambulatory Visit | Attending: Obstetrics and Gynecology | Admitting: Obstetrics and Gynecology

## 2019-10-15 DIAGNOSIS — Z1231 Encounter for screening mammogram for malignant neoplasm of breast: Secondary | ICD-10-CM | POA: Diagnosis not present

## 2019-10-17 DIAGNOSIS — M9905 Segmental and somatic dysfunction of pelvic region: Secondary | ICD-10-CM | POA: Diagnosis not present

## 2019-10-17 DIAGNOSIS — M4184 Other forms of scoliosis, thoracic region: Secondary | ICD-10-CM | POA: Diagnosis not present

## 2019-10-17 DIAGNOSIS — M5134 Other intervertebral disc degeneration, thoracic region: Secondary | ICD-10-CM | POA: Diagnosis not present

## 2019-10-17 DIAGNOSIS — M9903 Segmental and somatic dysfunction of lumbar region: Secondary | ICD-10-CM | POA: Diagnosis not present

## 2019-10-30 ENCOUNTER — Ambulatory Visit: Payer: Self-pay | Admitting: Psychiatry

## 2019-11-21 DIAGNOSIS — M5134 Other intervertebral disc degeneration, thoracic region: Secondary | ICD-10-CM | POA: Diagnosis not present

## 2019-11-21 DIAGNOSIS — M4184 Other forms of scoliosis, thoracic region: Secondary | ICD-10-CM | POA: Diagnosis not present

## 2019-11-21 DIAGNOSIS — M9905 Segmental and somatic dysfunction of pelvic region: Secondary | ICD-10-CM | POA: Diagnosis not present

## 2019-11-21 DIAGNOSIS — M9903 Segmental and somatic dysfunction of lumbar region: Secondary | ICD-10-CM | POA: Diagnosis not present

## 2019-11-28 DIAGNOSIS — Z23 Encounter for immunization: Secondary | ICD-10-CM | POA: Diagnosis not present

## 2019-12-02 DIAGNOSIS — M4184 Other forms of scoliosis, thoracic region: Secondary | ICD-10-CM | POA: Diagnosis not present

## 2019-12-02 DIAGNOSIS — M9903 Segmental and somatic dysfunction of lumbar region: Secondary | ICD-10-CM | POA: Diagnosis not present

## 2019-12-02 DIAGNOSIS — M9905 Segmental and somatic dysfunction of pelvic region: Secondary | ICD-10-CM | POA: Diagnosis not present

## 2019-12-02 DIAGNOSIS — M5134 Other intervertebral disc degeneration, thoracic region: Secondary | ICD-10-CM | POA: Diagnosis not present

## 2019-12-09 ENCOUNTER — Other Ambulatory Visit: Payer: Self-pay

## 2019-12-09 ENCOUNTER — Telehealth: Payer: Self-pay | Admitting: *Deleted

## 2019-12-09 ENCOUNTER — Encounter: Payer: Self-pay | Admitting: Physician Assistant

## 2019-12-09 ENCOUNTER — Ambulatory Visit: Payer: BC Managed Care – PPO | Admitting: Physician Assistant

## 2019-12-09 DIAGNOSIS — Z1283 Encounter for screening for malignant neoplasm of skin: Secondary | ICD-10-CM | POA: Diagnosis not present

## 2019-12-09 DIAGNOSIS — B078 Other viral warts: Secondary | ICD-10-CM | POA: Diagnosis not present

## 2019-12-09 DIAGNOSIS — L281 Prurigo nodularis: Secondary | ICD-10-CM

## 2019-12-09 MED ORDER — TRIAMCINOLONE ACETONIDE 0.1 % EX CREA: 1.0000 | TOPICAL_CREAM | Freq: Two times a day (BID) | CUTANEOUS | 2 refills | Status: DC | PRN

## 2019-12-09 NOTE — Progress Notes (Signed)
   Follow-Up Visit   Subjective  Jennifer Evans is a 59 y.o. 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   The following portions of the chart were reviewed this encounter and updated as appropriate:     Objective  Well appearing patient in no apparent distress; mood and affect are within normal limits.  A focused examination was performed including waist up and legs. Relevant physical exam findings are noted in the Assessment and Plan.  Objective  waist up: No atypical nevi No signs of non-mole skin cancer.   Objective  Left Forearm - Anterior, Right Forearm - Anterior: Ulcerations, pink papules excoriated  Objective  Right Thumb Tip: Verrucous papules -- Discussed viral etiology and contagion.   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; Right Forearm - Anterior  Flurandrenolide (CORDRAN) 4 MCG/SQCM TAPE - Left Forearm - Anterior, Right Forearm - Anterior  Ordered Medications: triamcinolone cream (KENALOG) 0.1 %  Common wart Right Thumb Tip  Destruction of lesion - Right Thumb Tip Complexity: simple   Destruction method: cryotherapy   Informed consent: discussed and consent obtained   Timeout:  patient name, date of birth, surgical site, and procedure verified Lesion destroyed using liquid nitrogen: Yes   Cryotherapy cycles:  1 Outcome: patient tolerated procedure well with no complications   Post-procedure details: wound care instructions given     I, Ebonique Hallstrom, PA-C, have reviewed all documentation's for this visit.  The documentation on 12/09/19 for the exam, diagnosis, procedures and orders are all accurate and complete.

## 2019-12-09 NOTE — Telephone Encounter (Signed)
Prior authorization done for Cordran Tape   Your information has been submitted to [place]. [employer] [place] will review the request and notify you of the determination decision directly, typically within 72 hours of receiving all information.  You will also receive your request decision electronically. To check for an update later, open this request again from your dashboard.  If [employer] [place] has not responded within the specified timeframe or if you have any questions about your PA submission, contact [facility] [place] directly at [phone].

## 2019-12-10 DIAGNOSIS — Z0000 Encounter for general adult medical examination without abnormal findings: Secondary | ICD-10-CM | POA: Diagnosis not present

## 2019-12-10 DIAGNOSIS — Z13220 Encounter for screening for lipoid disorders: Secondary | ICD-10-CM | POA: Diagnosis not present

## 2019-12-16 DIAGNOSIS — M9905 Segmental and somatic dysfunction of pelvic region: Secondary | ICD-10-CM | POA: Diagnosis not present

## 2019-12-16 DIAGNOSIS — M4184 Other forms of scoliosis, thoracic region: Secondary | ICD-10-CM | POA: Diagnosis not present

## 2019-12-16 DIAGNOSIS — M9903 Segmental and somatic dysfunction of lumbar region: Secondary | ICD-10-CM | POA: Diagnosis not present

## 2019-12-16 DIAGNOSIS — M5134 Other intervertebral disc degeneration, thoracic region: Secondary | ICD-10-CM | POA: Diagnosis not present

## 2019-12-19 DIAGNOSIS — Z0000 Encounter for general adult medical examination without abnormal findings: Secondary | ICD-10-CM | POA: Diagnosis not present

## 2019-12-26 DIAGNOSIS — Z23 Encounter for immunization: Secondary | ICD-10-CM | POA: Diagnosis not present

## 2020-01-01 DIAGNOSIS — Z6824 Body mass index (BMI) 24.0-24.9, adult: Secondary | ICD-10-CM | POA: Diagnosis not present

## 2020-01-01 DIAGNOSIS — Z01419 Encounter for gynecological examination (general) (routine) without abnormal findings: Secondary | ICD-10-CM | POA: Diagnosis not present

## 2020-01-02 ENCOUNTER — Encounter: Payer: Self-pay | Admitting: Gastroenterology

## 2020-01-02 DIAGNOSIS — M9905 Segmental and somatic dysfunction of pelvic region: Secondary | ICD-10-CM | POA: Diagnosis not present

## 2020-01-02 DIAGNOSIS — M9903 Segmental and somatic dysfunction of lumbar region: Secondary | ICD-10-CM | POA: Diagnosis not present

## 2020-01-02 DIAGNOSIS — M5134 Other intervertebral disc degeneration, thoracic region: Secondary | ICD-10-CM | POA: Diagnosis not present

## 2020-01-02 DIAGNOSIS — M4184 Other forms of scoliosis, thoracic region: Secondary | ICD-10-CM | POA: Diagnosis not present

## 2020-01-15 ENCOUNTER — Other Ambulatory Visit: Payer: Self-pay

## 2020-01-15 ENCOUNTER — Ambulatory Visit: Payer: BC Managed Care – PPO | Admitting: Podiatry

## 2020-01-15 DIAGNOSIS — M79671 Pain in right foot: Secondary | ICD-10-CM

## 2020-01-15 DIAGNOSIS — M722 Plantar fascial fibromatosis: Secondary | ICD-10-CM | POA: Diagnosis not present

## 2020-01-15 DIAGNOSIS — M79672 Pain in left foot: Secondary | ICD-10-CM

## 2020-01-15 MED ORDER — MELOXICAM 15 MG PO TABS
15.0000 mg | ORAL_TABLET | Freq: Every day | ORAL | 0 refills | Status: AC
Start: 2020-01-15 — End: 2021-01-14

## 2020-01-15 NOTE — Patient Instructions (Signed)
For instructions on how to put on your Plantar Fascial Brace, please visit [email or web address]   Plantar Fasciitis (Heel Spur Syndrome) with Rehab The plantar fascia is a fibrous, ligament-like, soft-tissue structure that spans the bottom of the foot. Plantar fasciitis is a condition that causes pain in the foot due to inflammation of the tissue. SYMPTOMS   Pain and tenderness on the underneath side of the foot.  Pain that worsens with standing or walking. CAUSES  Plantar fasciitis is caused by irritation and injury to the plantar fascia on the underneath side of the foot. Common mechanisms of injury include:  Direct trauma to bottom of the foot.  Damage to a small nerve that runs under the foot where the main fascia attaches to the heel bone.  Stress placed on the plantar fascia due to bone spurs. RISK INCREASES WITH:   Activities that place stress on the plantar fascia (running, jumping, pivoting, or cutting).  Poor strength and flexibility.  Improperly fitted shoes.  Tight calf muscles.  Flat feet.  Failure to warm-up properly before activity.  Obesity. PREVENTION  Warm up and stretch properly before activity.  Allow for adequate recovery between workouts.  Maintain physical fitness:  Strength, flexibility, and endurance.  Cardiovascular fitness.  Maintain a health body weight.  Avoid stress on the plantar fascia.  Wear properly fitted shoes, including arch supports for individuals who have flat feet.  PROGNOSIS  If treated properly, then the symptoms of plantar fasciitis usually resolve without surgery. However, occasionally surgery is necessary.  RELATED COMPLICATIONS   Recurrent symptoms that may result in a chronic condition.  Problems of the lower back that are caused by compensating for the injury, such as limping.  Pain or weakness of the foot during push-off following surgery.  Chronic inflammation, scarring, and partial or complete  fascia tear, occurring more often from repeated injections.  TREATMENT  Treatment initially involves the use of ice and medication to help reduce pain and inflammation. The use of strengthening and stretching exercises may help reduce pain with activity, especially stretches of the Achilles tendon. These exercises may be performed at home or with a therapist. Your caregiver may recommend that you use heel cups of arch supports to help reduce stress on the plantar fascia. Occasionally, corticosteroid injections are given to reduce inflammation. If symptoms persist for greater than 6 months despite non-surgical (conservative), then surgery may be recommended.   MEDICATION   If pain medication is necessary, then nonsteroidal anti-inflammatory medications, such as aspirin and ibuprofen, or other minor pain relievers, such as acetaminophen, are often recommended.  Do not take pain medication within 7 days before surgery.  Prescription pain relievers may be given if deemed necessary by your caregiver. Use only as directed and only as much as you need.  Corticosteroid injections may be given by your caregiver. These injections should be reserved for the most serious cases, because they may only be given a certain number of times.  HEAT AND COLD  Cold treatment (icing) relieves pain and reduces inflammation. Cold treatment should be applied for 10 to 15 minutes every 2 to 3 hours for inflammation and pain and immediately after any activity that aggravates your symptoms. Use ice packs or massage the area with a piece of ice (ice massage).  Heat treatment may be used prior to performing the stretching and strengthening activities prescribed by your caregiver, physical therapist, or athletic trainer. Use a heat pack or soak the injury in warm water.  SEEK IMMEDIATE MEDICAL   CARE IF:  Treatment seems to offer no benefit, or the condition worsens.  Any medications produce adverse side effects.   EXERCISES- RANGE OF MOTION (ROM) AND STRETCHING EXERCISES - Plantar Fasciitis (Heel Spur Syndrome) These exercises may help you when beginning to rehabilitate your injury. Your symptoms may resolve with or without further involvement from your physician, physical therapist or athletic trainer. While completing these exercises, remember:   Restoring tissue flexibility helps normal motion to return to the joints. This allows healthier, less painful movement and activity.  An effective stretch should be held for at least 30 seconds.  A stretch should never be painful. You should only feel a gentle lengthening or release in the stretched tissue.  RANGE OF MOTION - Toe Extension, Flexion  Sit with your right / left leg crossed over your opposite knee.  Grasp your toes and gently pull them back toward the top of your foot. You should feel a stretch on the bottom of your toes and/or foot.  Hold this stretch for 10 seconds.  Now, gently pull your toes toward the bottom of your foot. You should feel a stretch on the top of your toes and or foot.  Hold this stretch for 10 seconds. Repeat  times. Complete this stretch 3 times per day.   RANGE OF MOTION - Ankle Dorsiflexion, Active Assisted  Remove shoes and sit on a chair that is preferably not on a carpeted surface.  Place right / left foot under knee. Extend your opposite leg for support.  Keeping your heel down, slide your right / left foot back toward the chair until you feel a stretch at your ankle or calf. If you do not feel a stretch, slide your bottom forward to the edge of the chair, while still keeping your heel down.  Hold this stretch for 10 seconds. Repeat 3 times. Complete this stretch 2 times per day.   STRETCH  Gastroc, Standing  Place hands on wall.  Extend right / left leg, keeping the front knee somewhat bent.  Slightly point your toes inward on your back foot.  Keeping your right / left heel on the floor and your  knee straight, shift your weight toward the wall, not allowing your back to arch.  You should feel a gentle stretch in the right / left calf. Hold this position for 10 seconds. Repeat 3 times. Complete this stretch 2 times per day.  STRETCH  Soleus, Standing  Place hands on wall.  Extend right / left leg, keeping the other knee somewhat bent.  Slightly point your toes inward on your back foot.  Keep your right / left heel on the floor, bend your back knee, and slightly shift your weight over the back leg so that you feel a gentle stretch deep in your back calf.  Hold this position for 10 seconds. Repeat 3 times. Complete this stretch 2 times per day.  STRETCH  Gastrocsoleus, Standing  Note: This exercise can place a lot of stress on your foot and ankle. Please complete this exercise only if specifically instructed by your caregiver.   Place the ball of your right / left foot on a step, keeping your other foot firmly on the same step.  Hold on to the wall or a rail for balance.  Slowly lift your other foot, allowing your body weight to press your heel down over the edge of the step.  You should feel a stretch in your right / left calf.  Hold this   position for 10 seconds.  Repeat this exercise with a slight bend in your right / left knee. Repeat 3 times. Complete this stretch 2 times per day.   STRENGTHENING EXERCISES - Plantar Fasciitis (Heel Spur Syndrome)  These exercises may help you when beginning to rehabilitate your injury. They may resolve your symptoms with or without further involvement from your physician, physical therapist or athletic trainer. While completing these exercises, remember:   Muscles can gain both the endurance and the strength needed for everyday activities through controlled exercises.  Complete these exercises as instructed by your physician, physical therapist or athletic trainer. Progress the resistance and repetitions only as guided.  STRENGTH -  Towel Curls  Sit in a chair positioned on a non-carpeted surface.  Place your foot on a towel, keeping your heel on the floor.  Pull the towel toward your heel by only curling your toes. Keep your heel on the floor. Repeat 3 times. Complete this exercise 2 times per day.  STRENGTH - Ankle Inversion  Secure one end of a rubber exercise band/tubing to a fixed object (table, pole). Loop the other end around your foot just before your toes.  Place your fists between your knees. This will focus your strengthening at your ankle.  Slowly, pull your big toe up and in, making sure the band/tubing is positioned to resist the entire motion.  Hold this position for 10 seconds.  Have your muscles resist the band/tubing as it slowly pulls your foot back to the starting position. Repeat 3 times. Complete this exercises 2 times per day.  Document Released: 03/27/2005 Document Revised: 06/19/2011 Document Reviewed: 07/09/2008 [employer] Patient Information 2014 [employer]. 

## 2020-01-15 NOTE — Progress Notes (Signed)
Subjective: 59 year old female presents the office today with concerns of bilateral heel pain.  This started out 2 weeks ago.  She tried purchase over-the-counter inserts which did not help much.  She denies recent injury or trauma she denies any swelling.  No recent numbness or tingling.  The area of the callus in the left foot has resolved and having no issues. Denies any systemic complaints such as fevers, chills, nausea, vomiting. No acute changes since last appointment, and no other complaints at this time.   Objective: AAO x3, NAD DP/PT pulses palpable bilaterally, CRT less than 3 seconds There is tenderness palpation on the plantar medial tubercle of the calcaneus with insertion of the plantar fascia.  Mild discomfort on the benefit of plantar fashion the arch of the foot.  There is no pain with compression of calcaneus.  No pain with Achilles tendon.  No other areas of discomfort identified.  MMT 5/5.  No pain with calf compression, swelling, warmth, erythema  Assessment: 59 year old female bilateral plantar fasciitis  Plan: -All treatment options discussed with the patient including all alternatives, risks, complications.  -Discussed a steroid injection we held off on this today.  Prescribe meloxicam discussed side effects.  Discussed traction, icing daily.  Discussed shoe modifications and orthotics. Powersteps dispensed. Discussed break in period.  -Plantar fascial braces -Patient encouraged to call the office with any questions, concerns, change in symptoms.   Trula Slade DPM

## 2020-01-21 DIAGNOSIS — M4184 Other forms of scoliosis, thoracic region: Secondary | ICD-10-CM | POA: Diagnosis not present

## 2020-01-21 DIAGNOSIS — M9903 Segmental and somatic dysfunction of lumbar region: Secondary | ICD-10-CM | POA: Diagnosis not present

## 2020-01-21 DIAGNOSIS — M5134 Other intervertebral disc degeneration, thoracic region: Secondary | ICD-10-CM | POA: Diagnosis not present

## 2020-01-21 DIAGNOSIS — M9905 Segmental and somatic dysfunction of pelvic region: Secondary | ICD-10-CM | POA: Diagnosis not present

## 2020-01-23 DIAGNOSIS — M9905 Segmental and somatic dysfunction of pelvic region: Secondary | ICD-10-CM | POA: Diagnosis not present

## 2020-01-23 DIAGNOSIS — M4184 Other forms of scoliosis, thoracic region: Secondary | ICD-10-CM | POA: Diagnosis not present

## 2020-01-23 DIAGNOSIS — M5134 Other intervertebral disc degeneration, thoracic region: Secondary | ICD-10-CM | POA: Diagnosis not present

## 2020-01-23 DIAGNOSIS — M9903 Segmental and somatic dysfunction of lumbar region: Secondary | ICD-10-CM | POA: Diagnosis not present

## 2020-01-26 DIAGNOSIS — M9903 Segmental and somatic dysfunction of lumbar region: Secondary | ICD-10-CM | POA: Diagnosis not present

## 2020-01-26 DIAGNOSIS — M5134 Other intervertebral disc degeneration, thoracic region: Secondary | ICD-10-CM | POA: Diagnosis not present

## 2020-01-26 DIAGNOSIS — M4184 Other forms of scoliosis, thoracic region: Secondary | ICD-10-CM | POA: Diagnosis not present

## 2020-01-26 DIAGNOSIS — M9905 Segmental and somatic dysfunction of pelvic region: Secondary | ICD-10-CM | POA: Diagnosis not present

## 2020-02-02 ENCOUNTER — Ambulatory Visit (AMBULATORY_SURGERY_CENTER): Payer: Self-pay

## 2020-02-02 ENCOUNTER — Other Ambulatory Visit: Payer: Self-pay

## 2020-02-02 VITALS — Ht 64.0 in | Wt 138.0 lb

## 2020-02-02 DIAGNOSIS — Z1211 Encounter for screening for malignant neoplasm of colon: Secondary | ICD-10-CM

## 2020-02-02 NOTE — Progress Notes (Signed)
Patient's pre-visit was done today over the phone..   Name,DOB and address verified. Insurance verified. Packet of Prep instructions mailed to patient including copy of a consent form and pre-procedure patient acknowledgement form-pt is aware.    Patient understands to call us back with any questions or concerns.   COVID-19 series completed.   Pt is aware that care partner will wait in the car during procedure; if they feel like they will be too hot or cold to wait in the car; they may wait in the 4 th floor lobby. Patient is aware to bring only one care partner. We want them to wear a mask (we do not have any that we can provide them), practice social distancing, and we will check their temperatures when they get here.  I did remind the patient that their care partner needs to stay in the parking lot the entire time and have a cell phone available, we will call them when the pt is ready for discharge. Patient will wear mask into building.  No allergies to soy or egg Pt is not on blood thinners or diet pills Denies issues with sedation/intubation Denies atrial flutter/fib Denies constipation   Emmi instructions given to pt  Pt is aware of Covid safety and care partner requirements.  138 lbs within last 6 wks  Discussed use of benefiber which she said was sporadic and currently not using.  Instructed to not use 5 days prior to procedure.

## 2020-02-03 ENCOUNTER — Encounter: Payer: Self-pay | Admitting: Gastroenterology

## 2020-02-05 DIAGNOSIS — M9905 Segmental and somatic dysfunction of pelvic region: Secondary | ICD-10-CM | POA: Diagnosis not present

## 2020-02-05 DIAGNOSIS — M9903 Segmental and somatic dysfunction of lumbar region: Secondary | ICD-10-CM | POA: Diagnosis not present

## 2020-02-05 DIAGNOSIS — M4184 Other forms of scoliosis, thoracic region: Secondary | ICD-10-CM | POA: Diagnosis not present

## 2020-02-05 DIAGNOSIS — M5134 Other intervertebral disc degeneration, thoracic region: Secondary | ICD-10-CM | POA: Diagnosis not present

## 2020-02-24 DIAGNOSIS — M4184 Other forms of scoliosis, thoracic region: Secondary | ICD-10-CM | POA: Diagnosis not present

## 2020-02-24 DIAGNOSIS — M9903 Segmental and somatic dysfunction of lumbar region: Secondary | ICD-10-CM | POA: Diagnosis not present

## 2020-02-24 DIAGNOSIS — M5134 Other intervertebral disc degeneration, thoracic region: Secondary | ICD-10-CM | POA: Diagnosis not present

## 2020-02-24 DIAGNOSIS — M9905 Segmental and somatic dysfunction of pelvic region: Secondary | ICD-10-CM | POA: Diagnosis not present

## 2020-02-25 ENCOUNTER — Encounter: Payer: BC Managed Care – PPO | Admitting: Gastroenterology

## 2020-03-15 DIAGNOSIS — M9905 Segmental and somatic dysfunction of pelvic region: Secondary | ICD-10-CM | POA: Diagnosis not present

## 2020-03-15 DIAGNOSIS — M4184 Other forms of scoliosis, thoracic region: Secondary | ICD-10-CM | POA: Diagnosis not present

## 2020-03-15 DIAGNOSIS — M5134 Other intervertebral disc degeneration, thoracic region: Secondary | ICD-10-CM | POA: Diagnosis not present

## 2020-03-15 DIAGNOSIS — M9903 Segmental and somatic dysfunction of lumbar region: Secondary | ICD-10-CM | POA: Diagnosis not present

## 2020-03-23 DIAGNOSIS — R899 Unspecified abnormal finding in specimens from other organs, systems and tissues: Secondary | ICD-10-CM | POA: Diagnosis not present

## 2020-03-23 DIAGNOSIS — E041 Nontoxic single thyroid nodule: Secondary | ICD-10-CM | POA: Diagnosis not present

## 2020-04-15 DIAGNOSIS — M5134 Other intervertebral disc degeneration, thoracic region: Secondary | ICD-10-CM | POA: Diagnosis not present

## 2020-04-15 DIAGNOSIS — M4184 Other forms of scoliosis, thoracic region: Secondary | ICD-10-CM | POA: Diagnosis not present

## 2020-04-15 DIAGNOSIS — M9905 Segmental and somatic dysfunction of pelvic region: Secondary | ICD-10-CM | POA: Diagnosis not present

## 2020-04-15 DIAGNOSIS — M9903 Segmental and somatic dysfunction of lumbar region: Secondary | ICD-10-CM | POA: Diagnosis not present

## 2020-05-13 DIAGNOSIS — M5134 Other intervertebral disc degeneration, thoracic region: Secondary | ICD-10-CM | POA: Diagnosis not present

## 2020-05-13 DIAGNOSIS — M9903 Segmental and somatic dysfunction of lumbar region: Secondary | ICD-10-CM | POA: Diagnosis not present

## 2020-05-13 DIAGNOSIS — M4184 Other forms of scoliosis, thoracic region: Secondary | ICD-10-CM | POA: Diagnosis not present

## 2020-05-13 DIAGNOSIS — M9905 Segmental and somatic dysfunction of pelvic region: Secondary | ICD-10-CM | POA: Diagnosis not present

## 2020-05-20 ENCOUNTER — Encounter: Payer: Self-pay | Admitting: Gastroenterology

## 2020-06-10 DIAGNOSIS — M9905 Segmental and somatic dysfunction of pelvic region: Secondary | ICD-10-CM | POA: Diagnosis not present

## 2020-06-10 DIAGNOSIS — M9903 Segmental and somatic dysfunction of lumbar region: Secondary | ICD-10-CM | POA: Diagnosis not present

## 2020-06-10 DIAGNOSIS — M4184 Other forms of scoliosis, thoracic region: Secondary | ICD-10-CM | POA: Diagnosis not present

## 2020-06-10 DIAGNOSIS — M5134 Other intervertebral disc degeneration, thoracic region: Secondary | ICD-10-CM | POA: Diagnosis not present

## 2020-06-16 DIAGNOSIS — K219 Gastro-esophageal reflux disease without esophagitis: Secondary | ICD-10-CM | POA: Diagnosis not present

## 2020-06-16 DIAGNOSIS — J302 Other seasonal allergic rhinitis: Secondary | ICD-10-CM | POA: Diagnosis not present

## 2020-06-16 DIAGNOSIS — J45909 Unspecified asthma, uncomplicated: Secondary | ICD-10-CM | POA: Diagnosis not present

## 2020-06-23 DIAGNOSIS — M5134 Other intervertebral disc degeneration, thoracic region: Secondary | ICD-10-CM | POA: Diagnosis not present

## 2020-06-23 DIAGNOSIS — M9905 Segmental and somatic dysfunction of pelvic region: Secondary | ICD-10-CM | POA: Diagnosis not present

## 2020-06-23 DIAGNOSIS — M4184 Other forms of scoliosis, thoracic region: Secondary | ICD-10-CM | POA: Diagnosis not present

## 2020-06-23 DIAGNOSIS — M9903 Segmental and somatic dysfunction of lumbar region: Secondary | ICD-10-CM | POA: Diagnosis not present

## 2020-07-16 DIAGNOSIS — M9905 Segmental and somatic dysfunction of pelvic region: Secondary | ICD-10-CM | POA: Diagnosis not present

## 2020-07-16 DIAGNOSIS — M5134 Other intervertebral disc degeneration, thoracic region: Secondary | ICD-10-CM | POA: Diagnosis not present

## 2020-07-16 DIAGNOSIS — M4184 Other forms of scoliosis, thoracic region: Secondary | ICD-10-CM | POA: Diagnosis not present

## 2020-07-16 DIAGNOSIS — M9903 Segmental and somatic dysfunction of lumbar region: Secondary | ICD-10-CM | POA: Diagnosis not present

## 2020-07-27 DIAGNOSIS — R309 Painful micturition, unspecified: Secondary | ICD-10-CM | POA: Diagnosis not present

## 2020-07-29 DIAGNOSIS — Z23 Encounter for immunization: Secondary | ICD-10-CM | POA: Diagnosis not present

## 2020-08-11 DIAGNOSIS — M9903 Segmental and somatic dysfunction of lumbar region: Secondary | ICD-10-CM | POA: Diagnosis not present

## 2020-08-11 DIAGNOSIS — M9905 Segmental and somatic dysfunction of pelvic region: Secondary | ICD-10-CM | POA: Diagnosis not present

## 2020-08-11 DIAGNOSIS — M4184 Other forms of scoliosis, thoracic region: Secondary | ICD-10-CM | POA: Diagnosis not present

## 2020-08-11 DIAGNOSIS — M5134 Other intervertebral disc degeneration, thoracic region: Secondary | ICD-10-CM | POA: Diagnosis not present

## 2020-08-20 DIAGNOSIS — M9903 Segmental and somatic dysfunction of lumbar region: Secondary | ICD-10-CM | POA: Diagnosis not present

## 2020-08-20 DIAGNOSIS — M4184 Other forms of scoliosis, thoracic region: Secondary | ICD-10-CM | POA: Diagnosis not present

## 2020-08-20 DIAGNOSIS — M5134 Other intervertebral disc degeneration, thoracic region: Secondary | ICD-10-CM | POA: Diagnosis not present

## 2020-08-20 DIAGNOSIS — M9905 Segmental and somatic dysfunction of pelvic region: Secondary | ICD-10-CM | POA: Diagnosis not present

## 2020-09-10 ENCOUNTER — Other Ambulatory Visit: Payer: Self-pay | Admitting: Obstetrics and Gynecology

## 2020-09-10 DIAGNOSIS — Z1231 Encounter for screening mammogram for malignant neoplasm of breast: Secondary | ICD-10-CM

## 2020-10-01 DIAGNOSIS — M4184 Other forms of scoliosis, thoracic region: Secondary | ICD-10-CM | POA: Diagnosis not present

## 2020-10-01 DIAGNOSIS — M5134 Other intervertebral disc degeneration, thoracic region: Secondary | ICD-10-CM | POA: Diagnosis not present

## 2020-10-01 DIAGNOSIS — M9905 Segmental and somatic dysfunction of pelvic region: Secondary | ICD-10-CM | POA: Diagnosis not present

## 2020-10-01 DIAGNOSIS — M9903 Segmental and somatic dysfunction of lumbar region: Secondary | ICD-10-CM | POA: Diagnosis not present

## 2020-10-21 DIAGNOSIS — M4184 Other forms of scoliosis, thoracic region: Secondary | ICD-10-CM | POA: Diagnosis not present

## 2020-10-21 DIAGNOSIS — M5134 Other intervertebral disc degeneration, thoracic region: Secondary | ICD-10-CM | POA: Diagnosis not present

## 2020-10-21 DIAGNOSIS — M9905 Segmental and somatic dysfunction of pelvic region: Secondary | ICD-10-CM | POA: Diagnosis not present

## 2020-10-21 DIAGNOSIS — M9903 Segmental and somatic dysfunction of lumbar region: Secondary | ICD-10-CM | POA: Diagnosis not present

## 2020-11-04 ENCOUNTER — Ambulatory Visit: Payer: BC Managed Care – PPO

## 2020-11-04 DIAGNOSIS — M5134 Other intervertebral disc degeneration, thoracic region: Secondary | ICD-10-CM | POA: Diagnosis not present

## 2020-11-04 DIAGNOSIS — M4184 Other forms of scoliosis, thoracic region: Secondary | ICD-10-CM | POA: Diagnosis not present

## 2020-11-04 DIAGNOSIS — M9903 Segmental and somatic dysfunction of lumbar region: Secondary | ICD-10-CM | POA: Diagnosis not present

## 2020-11-04 DIAGNOSIS — M9905 Segmental and somatic dysfunction of pelvic region: Secondary | ICD-10-CM | POA: Diagnosis not present

## 2020-11-05 ENCOUNTER — Ambulatory Visit
Admission: RE | Admit: 2020-11-05 | Discharge: 2020-11-05 | Disposition: A | Discharge status: Home / Self Care | Payer: BC Managed Care – PPO | Source: Ambulatory Visit | Attending: Obstetrics and Gynecology | Admitting: Obstetrics and Gynecology

## 2020-11-05 ENCOUNTER — Other Ambulatory Visit: Payer: Self-pay

## 2020-11-05 ENCOUNTER — Ambulatory Visit: Payer: BC Managed Care – PPO

## 2020-11-05 DIAGNOSIS — Z1231 Encounter for screening mammogram for malignant neoplasm of breast: Secondary | ICD-10-CM | POA: Diagnosis not present

## 2020-11-16 DIAGNOSIS — M5134 Other intervertebral disc degeneration, thoracic region: Secondary | ICD-10-CM | POA: Diagnosis not present

## 2020-11-16 DIAGNOSIS — M9905 Segmental and somatic dysfunction of pelvic region: Secondary | ICD-10-CM | POA: Diagnosis not present

## 2020-11-16 DIAGNOSIS — M4184 Other forms of scoliosis, thoracic region: Secondary | ICD-10-CM | POA: Diagnosis not present

## 2020-11-16 DIAGNOSIS — M9903 Segmental and somatic dysfunction of lumbar region: Secondary | ICD-10-CM | POA: Diagnosis not present

## 2020-11-18 DIAGNOSIS — H6120 Impacted cerumen, unspecified ear: Secondary | ICD-10-CM | POA: Diagnosis not present

## 2020-11-18 DIAGNOSIS — H8110 Benign paroxysmal vertigo, unspecified ear: Secondary | ICD-10-CM | POA: Diagnosis not present

## 2020-12-06 DIAGNOSIS — M4184 Other forms of scoliosis, thoracic region: Secondary | ICD-10-CM | POA: Diagnosis not present

## 2020-12-06 DIAGNOSIS — M9905 Segmental and somatic dysfunction of pelvic region: Secondary | ICD-10-CM | POA: Diagnosis not present

## 2020-12-06 DIAGNOSIS — M9903 Segmental and somatic dysfunction of lumbar region: Secondary | ICD-10-CM | POA: Diagnosis not present

## 2020-12-06 DIAGNOSIS — M5134 Other intervertebral disc degeneration, thoracic region: Secondary | ICD-10-CM | POA: Diagnosis not present

## 2020-12-06 DIAGNOSIS — M7918 Myalgia, other site: Secondary | ICD-10-CM | POA: Diagnosis not present

## 2020-12-08 DIAGNOSIS — R1084 Generalized abdominal pain: Secondary | ICD-10-CM | POA: Diagnosis not present

## 2020-12-15 DIAGNOSIS — R140 Abdominal distension (gaseous): Secondary | ICD-10-CM | POA: Diagnosis not present

## 2021-01-04 DIAGNOSIS — Z0000 Encounter for general adult medical examination without abnormal findings: Secondary | ICD-10-CM | POA: Diagnosis not present

## 2021-01-04 DIAGNOSIS — J45909 Unspecified asthma, uncomplicated: Secondary | ICD-10-CM | POA: Diagnosis not present

## 2021-01-04 DIAGNOSIS — M8580 Other specified disorders of bone density and structure, unspecified site: Secondary | ICD-10-CM | POA: Diagnosis not present

## 2021-01-04 DIAGNOSIS — E782 Mixed hyperlipidemia: Secondary | ICD-10-CM | POA: Diagnosis not present

## 2021-01-04 DIAGNOSIS — I10 Essential (primary) hypertension: Secondary | ICD-10-CM | POA: Diagnosis not present

## 2021-01-04 DIAGNOSIS — E041 Nontoxic single thyroid nodule: Secondary | ICD-10-CM | POA: Diagnosis not present

## 2021-01-18 DIAGNOSIS — M545 Low back pain, unspecified: Secondary | ICD-10-CM | POA: Diagnosis not present

## 2021-01-18 DIAGNOSIS — Z01419 Encounter for gynecological examination (general) (routine) without abnormal findings: Secondary | ICD-10-CM | POA: Diagnosis not present

## 2021-01-18 DIAGNOSIS — M79672 Pain in left foot: Secondary | ICD-10-CM | POA: Diagnosis not present

## 2021-01-18 DIAGNOSIS — M4184 Other forms of scoliosis, thoracic region: Secondary | ICD-10-CM | POA: Diagnosis not present

## 2021-01-18 DIAGNOSIS — M9906 Segmental and somatic dysfunction of lower extremity: Secondary | ICD-10-CM | POA: Diagnosis not present

## 2021-01-18 DIAGNOSIS — M722 Plantar fascial fibromatosis: Secondary | ICD-10-CM | POA: Diagnosis not present

## 2021-01-18 DIAGNOSIS — Z6824 Body mass index (BMI) 24.0-24.9, adult: Secondary | ICD-10-CM | POA: Diagnosis not present

## 2021-01-18 DIAGNOSIS — M9901 Segmental and somatic dysfunction of cervical region: Secondary | ICD-10-CM | POA: Diagnosis not present

## 2021-01-18 DIAGNOSIS — M5134 Other intervertebral disc degeneration, thoracic region: Secondary | ICD-10-CM | POA: Diagnosis not present

## 2021-01-18 DIAGNOSIS — M9903 Segmental and somatic dysfunction of lumbar region: Secondary | ICD-10-CM | POA: Diagnosis not present

## 2021-01-18 DIAGNOSIS — Z13820 Encounter for screening for osteoporosis: Secondary | ICD-10-CM | POA: Diagnosis not present

## 2021-01-18 DIAGNOSIS — M7918 Myalgia, other site: Secondary | ICD-10-CM | POA: Diagnosis not present

## 2021-01-21 ENCOUNTER — Other Ambulatory Visit: Payer: Self-pay | Admitting: Obstetrics and Gynecology

## 2021-01-21 DIAGNOSIS — E041 Nontoxic single thyroid nodule: Secondary | ICD-10-CM

## 2021-01-31 ENCOUNTER — Ambulatory Visit
Admission: RE | Admit: 2021-01-31 | Discharge: 2021-01-31 | Disposition: A | Discharge status: Home / Self Care | Payer: BC Managed Care – PPO | Source: Ambulatory Visit | Attending: Obstetrics and Gynecology | Admitting: Obstetrics and Gynecology

## 2021-01-31 DIAGNOSIS — E041 Nontoxic single thyroid nodule: Secondary | ICD-10-CM

## 2021-02-03 DIAGNOSIS — M9903 Segmental and somatic dysfunction of lumbar region: Secondary | ICD-10-CM | POA: Diagnosis not present

## 2021-02-03 DIAGNOSIS — M9905 Segmental and somatic dysfunction of pelvic region: Secondary | ICD-10-CM | POA: Diagnosis not present

## 2021-02-03 DIAGNOSIS — M5134 Other intervertebral disc degeneration, thoracic region: Secondary | ICD-10-CM | POA: Diagnosis not present

## 2021-02-03 DIAGNOSIS — M79672 Pain in left foot: Secondary | ICD-10-CM | POA: Diagnosis not present

## 2021-02-03 DIAGNOSIS — M9901 Segmental and somatic dysfunction of cervical region: Secondary | ICD-10-CM | POA: Diagnosis not present

## 2021-02-03 DIAGNOSIS — M4184 Other forms of scoliosis, thoracic region: Secondary | ICD-10-CM | POA: Diagnosis not present

## 2021-02-03 DIAGNOSIS — M9906 Segmental and somatic dysfunction of lower extremity: Secondary | ICD-10-CM | POA: Diagnosis not present

## 2021-02-03 DIAGNOSIS — M7918 Myalgia, other site: Secondary | ICD-10-CM | POA: Diagnosis not present

## 2021-02-07 ENCOUNTER — Other Ambulatory Visit: Payer: Self-pay

## 2021-02-07 ENCOUNTER — Other Ambulatory Visit: Payer: Self-pay | Admitting: Obstetrics and Gynecology

## 2021-02-07 DIAGNOSIS — E041 Nontoxic single thyroid nodule: Secondary | ICD-10-CM

## 2021-02-09 ENCOUNTER — Other Ambulatory Visit (HOSPITAL_COMMUNITY)
Admission: RE | Admit: 2021-02-09 | Discharge: 2021-02-09 | Disposition: A | Discharge status: Home / Self Care | Payer: BC Managed Care – PPO | Source: Ambulatory Visit | Attending: Interventional Radiology | Admitting: Interventional Radiology

## 2021-02-09 ENCOUNTER — Ambulatory Visit
Admission: RE | Admit: 2021-02-09 | Discharge: 2021-02-09 | Disposition: A | Discharge status: Home / Self Care | Payer: BC Managed Care – PPO | Source: Ambulatory Visit | Attending: Obstetrics and Gynecology | Admitting: Obstetrics and Gynecology

## 2021-02-09 DIAGNOSIS — D440 Neoplasm of uncertain behavior of thyroid gland: Secondary | ICD-10-CM | POA: Insufficient documentation

## 2021-02-09 DIAGNOSIS — E041 Nontoxic single thyroid nodule: Secondary | ICD-10-CM

## 2021-02-16 LAB — CYTOLOGY - NON PAP

## 2021-02-18 DIAGNOSIS — E041 Nontoxic single thyroid nodule: Secondary | ICD-10-CM | POA: Diagnosis not present

## 2021-02-23 DIAGNOSIS — M5134 Other intervertebral disc degeneration, thoracic region: Secondary | ICD-10-CM | POA: Diagnosis not present

## 2021-02-23 DIAGNOSIS — M9905 Segmental and somatic dysfunction of pelvic region: Secondary | ICD-10-CM | POA: Diagnosis not present

## 2021-02-23 DIAGNOSIS — M545 Low back pain, unspecified: Secondary | ICD-10-CM | POA: Diagnosis not present

## 2021-02-23 DIAGNOSIS — M4184 Other forms of scoliosis, thoracic region: Secondary | ICD-10-CM | POA: Diagnosis not present

## 2021-02-23 DIAGNOSIS — M9906 Segmental and somatic dysfunction of lower extremity: Secondary | ICD-10-CM | POA: Diagnosis not present

## 2021-02-23 DIAGNOSIS — M7918 Myalgia, other site: Secondary | ICD-10-CM | POA: Diagnosis not present

## 2021-02-23 DIAGNOSIS — M9901 Segmental and somatic dysfunction of cervical region: Secondary | ICD-10-CM | POA: Diagnosis not present

## 2021-02-23 DIAGNOSIS — M9903 Segmental and somatic dysfunction of lumbar region: Secondary | ICD-10-CM | POA: Diagnosis not present

## 2021-02-23 DIAGNOSIS — M79672 Pain in left foot: Secondary | ICD-10-CM | POA: Diagnosis not present

## 2021-02-28 DIAGNOSIS — R898 Other abnormal findings in specimens from other organs, systems and tissues: Secondary | ICD-10-CM | POA: Diagnosis not present

## 2021-02-28 DIAGNOSIS — I10 Essential (primary) hypertension: Secondary | ICD-10-CM | POA: Diagnosis not present

## 2021-02-28 DIAGNOSIS — R899 Unspecified abnormal finding in specimens from other organs, systems and tissues: Secondary | ICD-10-CM | POA: Diagnosis not present

## 2021-02-28 DIAGNOSIS — E041 Nontoxic single thyroid nodule: Secondary | ICD-10-CM | POA: Diagnosis not present

## 2021-03-11 ENCOUNTER — Telehealth: Payer: Self-pay | Admitting: Genetic Counselor

## 2021-03-11 NOTE — Telephone Encounter (Signed)
Scheduled appt per 11/29 referral. Pt's husband is aware of appt date and time. He said that she would prefer to schedule this appt sometime afer 12/8 due to them wanting to meet with her surgeon first. Appt was scheduled for 12/13 per request.

## 2021-03-17 DIAGNOSIS — D440 Neoplasm of uncertain behavior of thyroid gland: Secondary | ICD-10-CM | POA: Diagnosis not present

## 2021-03-17 DIAGNOSIS — E041 Nontoxic single thyroid nodule: Secondary | ICD-10-CM | POA: Diagnosis not present

## 2021-03-18 ENCOUNTER — Ambulatory Visit: Payer: Self-pay | Admitting: Surgery

## 2021-03-18 DIAGNOSIS — E041 Nontoxic single thyroid nodule: Secondary | ICD-10-CM | POA: Diagnosis not present

## 2021-03-18 DIAGNOSIS — E890 Postprocedural hypothyroidism: Secondary | ICD-10-CM | POA: Diagnosis not present

## 2021-03-18 DIAGNOSIS — C73 Malignant neoplasm of thyroid gland: Secondary | ICD-10-CM | POA: Diagnosis not present

## 2021-03-18 DIAGNOSIS — D440 Neoplasm of uncertain behavior of thyroid gland: Secondary | ICD-10-CM | POA: Diagnosis not present

## 2021-03-18 NOTE — H&P (Signed)
REFERRING PHYSICIAN: McComb, Gwynn Burly, MD  PROVIDER: Malyk Girouard Charlotta Newton, MD   DATE OF ENCOUNTER: 03/17/2021   Chief Complaint: New Consultation (Thyroid neoplasm of uncertain behavior)   History of Present Illness:  Patient returns to my practice on referral from Dr. Arvella Nigh for follow-up of thyroid nodules. Patient had been followed for many years with physical exam and sequential ultrasound scanning. She was last evaluated here in 2018. At that time an ultrasound demonstrated a 9 mm hypoechoic nodule in the left thyroid lobe. Biopsy was not indicated and radiology released the patient from further follow-up. Patient has had no prior head or neck surgery. She has never been on thyroid medication. She denies any compressive symptoms. Patient underwent ultrasound on January 31, 2021. This demonstrated a small mildly heterogeneous thyroid gland. The nodule in the left superior pole had increased in size to 1.5 cm. It was solid. It was hypoechoic. It was somewhat lobulated and irregular. It was felt to be moderately suspicious and biopsy was recommended. Patient underwent ultrasound-guided fine-needle aspiration biopsy on February 09, 2021. This demonstrated a follicular neoplasm, Bethesda category IV. Specimen was submitted for molecular genetic testing, AFIRMA, which returned with a result of suspicious, rendering a risk of malignancy of 50%. 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 This gives the patient a risk of medullary thyroid cancer of greater than 99%.  Patient has noted no change in self-examination. She has no compressive symptoms. There is no family history of thyroid cancer or other endocrine malignancy or neoplasm. Patient has apparently been referred for genetic testing. She has also been seen prior to these results by Dr. Madelin Rear from endocrinology.  Review of Systems: A complete review of systems was obtained from the patient. I  have reviewed this information and discussed as appropriate with the patient. See HPI as well for other ROS.  Review of Systems  Constitutional: Negative.  HENT: Negative.  Eyes: Negative.  Respiratory: Negative.  Cardiovascular: Negative.  Gastrointestinal: Negative.  Genitourinary: Negative.  Musculoskeletal: Negative.  Skin: Negative.  Neurological: Negative.  Endo/Heme/Allergies: Negative.  Psychiatric/Behavioral: The patient is nervous/anxious.    Medical History: Past Medical History:  Diagnosis Date   Asthma, unspecified asthma severity, unspecified whether complicated, unspecified whether persistent   GERD (gastroesophageal reflux disease)   Hypertension   Patient Active Problem List  Diagnosis   Neoplasm of uncertain behavior of thyroid gland   Thyroid nodule   Past Surgical History:  Procedure Laterality Date   APPENDECTOMY   WRIST ARTHROCENTESIS 12/2018  broken    Allergies  Allergen Reactions   Ketamine Hallucination and Other (See Comments)   Current Outpatient Medications on File Prior to Visit  Medication Sig Dispense Refill   hydroCHLOROthiazide (MICROZIDE) 12.5 mg capsule hydrochlorothiazide 12.5 mg capsule TAKE 1 CAPSULE BY MOUTH EVERY DAY IN THE MORNING   cetirizine (ZYRTEC) 10 MG tablet daily   losartan (COZAAR) 25 MG tablet losartan 25 mg tablet   montelukast (SINGULAIR) 10 mg tablet montelukast 10 mg tablet TAKE 1 TABLET BY MOUTH EVERY DAY   No current facility-administered medications on file prior to visit.   Family History  Problem Relation Age of Onset   High blood pressure (Hypertension) Mother   Breast cancer Mother   Stroke Father   Skin cancer Brother    Social History   Tobacco Use  Smoking Status Never  Smokeless Tobacco Never    Social History   Socioeconomic History   Marital  status: Married  Tobacco Use   Smoking status: Never   Smokeless tobacco: Never  Substance and Sexual Activity   Alcohol use: Yes    Drug use: Never   Objective:   Vitals:  03/17/21 1630  BP: (!) 140/80  Pulse: 106  Temp: 36.8 C (98.2 F)  SpO2: 98%  Weight: 63.3 kg (139 lb 9.6 oz)  Height: 161.3 cm (5' 3.5")   Body mass index is 24.34 kg/m.  Physical Exam   GENERAL APPEARANCE Development: normal Nutritional status: normal Gross deformities: none  SKIN Rash, lesions, ulcers: none Induration, erythema: none Nodules: none palpable  EYES Conjunctiva and lids: normal Pupils: equal and reactive Iris: normal bilaterally  EARS, NOSE, MOUTH, THROAT External ears: no lesion or deformity External nose: no lesion or deformity Hearing: grossly normal Due to Covid-19 pandemic, patient is wearing a mask.  NECK Symmetric: yes Trachea: midline Thyroid: no palpable nodules in the thyroid bed  CHEST Respiratory effort: normal Retraction or accessory muscle use: no Breath sounds: normal bilaterally Rales, rhonchi, wheeze: none  CARDIOVASCULAR Auscultation: regular rhythm, normal rate Murmurs: none Pulses: radial pulse 2+ palpable Lower extremity edema: none  MUSCULOSKELETAL Station and gait: normal Digits and nails: no clubbing or cyanosis Muscle strength: grossly normal all extremities Range of motion: grossly normal all extremities Deformity: none  LYMPHATIC Cervical: none palpable Supraclavicular: none palpable  PSYCHIATRIC Oriented to person, place, and time: yes Mood and affect: normal for situation Judgment and insight: appropriate for situation  Assessment and Plan:   Neoplasm of uncertain behavior of thyroid gland  Thyroid nodule   Patient presents today accompanied by her husband to review the above findings and results. We reviewed her ultrasound, her clinical history, her biopsy results, and her molecular genetic testing. We discussed medullary thyroid carcinoma. We discussed proceeding with total thyroidectomy with limited lymph node dissection for definitive  diagnosis.  We discussed the thyroid procedure at length. I explained the size and location of the surgical incision. We discussed the hospital stay to be anticipated. We discussed the risk and benefits including the risk of recurrent laryngeal nerve injury and injury to parathyroid glands. We discussed the need for lifelong thyroid hormone replacement. We discussed referral to endocrinology for assistance with management. We also discussed the need to evaluate the patient further for possible MEN-2a syndrome, although I feel this is highly unlikely given no family history. Patient will also likely be referred for genetic testing.  Patient would like to move forward with scheduling thyroidectomy as soon as possible. We will obtain a baseline calcitonin level at this time. I will submit orders to scheduling to arrange for thyroid surgery in the near future.  The risks and benefits of the procedure have been discussed at length with the patient. The patient understands the proposed procedure, potential alternative treatments, and the course of recovery to be expected. All of the patient's questions have been answered at this time. The patient wishes to proceed with surgery.   Armandina Gemma, MD [facility] [place] practice Office: [phone]

## 2021-03-18 NOTE — H&P (View-Only) (Signed)
REFERRING PHYSICIAN: McComb, Gwynn Burly, MD  PROVIDER: Ayra Hodgdon Charlotta Newton, MD   DATE OF ENCOUNTER: 03/17/2021   Chief Complaint: New Consultation (Thyroid neoplasm of uncertain behavior)   History of Present Illness:  Patient returns to my practice on referral from Dr. Arvella Nigh for follow-up of thyroid nodules. Patient had been followed for many years with physical exam and sequential ultrasound scanning. She was last evaluated here in 2018. At that time an ultrasound demonstrated a 9 mm hypoechoic nodule in the left thyroid lobe. Biopsy was not indicated and radiology released the patient from further follow-up. Patient has had no prior head or neck surgery. She has never been on thyroid medication. She denies any compressive symptoms. Patient underwent ultrasound on January 31, 2021. This demonstrated a small mildly heterogeneous thyroid gland. The nodule in the left superior pole had increased in size to 1.5 cm. It was solid. It was hypoechoic. It was somewhat lobulated and irregular. It was felt to be moderately suspicious and biopsy was recommended. Patient underwent ultrasound-guided fine-needle aspiration biopsy on February 09, 2021. This demonstrated a follicular neoplasm, Bethesda category IV. Specimen was submitted for molecular genetic testing, AFIRMA, which returned with a result of suspicious, rendering a risk of malignancy of 50%. 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 This gives the patient a risk of medullary thyroid cancer of greater than 99%.  Patient has noted no change in self-examination. She has no compressive symptoms. There is no family history of thyroid cancer or other endocrine malignancy or neoplasm. Patient has apparently been referred for genetic testing. She has also been seen prior to these results by Dr. Madelin Rear from endocrinology.  Review of Systems: A complete review of systems was obtained from the patient. I  have reviewed this information and discussed as appropriate with the patient. See HPI as well for other ROS.  Review of Systems  Constitutional: Negative.  HENT: Negative.  Eyes: Negative.  Respiratory: Negative.  Cardiovascular: Negative.  Gastrointestinal: Negative.  Genitourinary: Negative.  Musculoskeletal: Negative.  Skin: Negative.  Neurological: Negative.  Endo/Heme/Allergies: Negative.  Psychiatric/Behavioral: The patient is nervous/anxious.    Medical History: Past Medical History:  Diagnosis Date   Asthma, unspecified asthma severity, unspecified whether complicated, unspecified whether persistent   GERD (gastroesophageal reflux disease)   Hypertension   Patient Active Problem List  Diagnosis   Neoplasm of uncertain behavior of thyroid gland   Thyroid nodule   Past Surgical History:  Procedure Laterality Date   APPENDECTOMY   WRIST ARTHROCENTESIS 12/2018  broken    Allergies  Allergen Reactions   Ketamine Hallucination and Other (See Comments)   Current Outpatient Medications on File Prior to Visit  Medication Sig Dispense Refill   hydroCHLOROthiazide (MICROZIDE) 12.5 mg capsule hydrochlorothiazide 12.5 mg capsule TAKE 1 CAPSULE BY MOUTH EVERY DAY IN THE MORNING   cetirizine (ZYRTEC) 10 MG tablet daily   losartan (COZAAR) 25 MG tablet losartan 25 mg tablet   montelukast (SINGULAIR) 10 mg tablet montelukast 10 mg tablet TAKE 1 TABLET BY MOUTH EVERY DAY   No current facility-administered medications on file prior to visit.   Family History  Problem Relation Age of Onset   High blood pressure (Hypertension) Mother   Breast cancer Mother   Stroke Father   Skin cancer Brother    Social History   Tobacco Use  Smoking Status Never  Smokeless Tobacco Never    Social History   Socioeconomic History   Marital  status: Married  Tobacco Use   Smoking status: Never   Smokeless tobacco: Never  Substance and Sexual Activity   Alcohol use: Yes    Drug use: Never   Objective:   Vitals:  03/17/21 1630  BP: (!) 140/80  Pulse: 106  Temp: 36.8 C (98.2 F)  SpO2: 98%  Weight: 63.3 kg (139 lb 9.6 oz)  Height: 161.3 cm (5' 3.5")   Body mass index is 24.34 kg/m.  Physical Exam   GENERAL APPEARANCE Development: normal Nutritional status: normal Gross deformities: none  SKIN Rash, lesions, ulcers: none Induration, erythema: none Nodules: none palpable  EYES Conjunctiva and lids: normal Pupils: equal and reactive Iris: normal bilaterally  EARS, NOSE, MOUTH, THROAT External ears: no lesion or deformity External nose: no lesion or deformity Hearing: grossly normal Due to Covid-19 pandemic, patient is wearing a mask.  NECK Symmetric: yes Trachea: midline Thyroid: no palpable nodules in the thyroid bed  CHEST Respiratory effort: normal Retraction or accessory muscle use: no Breath sounds: normal bilaterally Rales, rhonchi, wheeze: none  CARDIOVASCULAR Auscultation: regular rhythm, normal rate Murmurs: none Pulses: radial pulse 2+ palpable Lower extremity edema: none  MUSCULOSKELETAL Station and gait: normal Digits and nails: no clubbing or cyanosis Muscle strength: grossly normal all extremities Range of motion: grossly normal all extremities Deformity: none  LYMPHATIC Cervical: none palpable Supraclavicular: none palpable  PSYCHIATRIC Oriented to person, place, and time: yes Mood and affect: normal for situation Judgment and insight: appropriate for situation  Assessment and Plan:   Neoplasm of uncertain behavior of thyroid gland  Thyroid nodule   Patient presents today accompanied by her husband to review the above findings and results. We reviewed her ultrasound, her clinical history, her biopsy results, and her molecular genetic testing. We discussed medullary thyroid carcinoma. We discussed proceeding with total thyroidectomy with limited lymph node dissection for definitive  diagnosis.  We discussed the thyroid procedure at length. I explained the size and location of the surgical incision. We discussed the hospital stay to be anticipated. We discussed the risk and benefits including the risk of recurrent laryngeal nerve injury and injury to parathyroid glands. We discussed the need for lifelong thyroid hormone replacement. We discussed referral to endocrinology for assistance with management. We also discussed the need to evaluate the patient further for possible MEN-2a syndrome, although I feel this is highly unlikely given no family history. Patient will also likely be referred for genetic testing.  Patient would like to move forward with scheduling thyroidectomy as soon as possible. We will obtain a baseline calcitonin level at this time. I will submit orders to scheduling to arrange for thyroid surgery in the near future.  The risks and benefits of the procedure have been discussed at length with the patient. The patient understands the proposed procedure, potential alternative treatments, and the course of recovery to be expected. All of the patient's questions have been answered at this time. The patient wishes to proceed with surgery.   Armandina Gemma, MD [facility] [place] practice Office: [phone]

## 2021-03-20 ENCOUNTER — Encounter: Payer: Self-pay | Admitting: Surgery

## 2021-03-20 DIAGNOSIS — D440 Neoplasm of uncertain behavior of thyroid gland: Secondary | ICD-10-CM | POA: Diagnosis present

## 2021-03-21 ENCOUNTER — Other Ambulatory Visit: Payer: Self-pay

## 2021-03-21 ENCOUNTER — Inpatient Hospital Stay: Payer: BC Managed Care – PPO

## 2021-03-21 ENCOUNTER — Encounter: Payer: Self-pay | Admitting: Licensed Clinical Social Worker

## 2021-03-21 ENCOUNTER — Inpatient Hospital Stay: Payer: BC Managed Care – PPO | Attending: Genetic Counselor | Admitting: Licensed Clinical Social Worker

## 2021-03-21 DIAGNOSIS — Z808 Family history of malignant neoplasm of other organs or systems: Secondary | ICD-10-CM | POA: Diagnosis not present

## 2021-03-21 DIAGNOSIS — Z803 Family history of malignant neoplasm of breast: Secondary | ICD-10-CM | POA: Diagnosis not present

## 2021-03-21 DIAGNOSIS — I10 Essential (primary) hypertension: Secondary | ICD-10-CM | POA: Diagnosis not present

## 2021-03-21 DIAGNOSIS — D440 Neoplasm of uncertain behavior of thyroid gland: Secondary | ICD-10-CM

## 2021-03-21 LAB — GENETIC SCREENING ORDER

## 2021-03-21 NOTE — Progress Notes (Signed)
REFERRING PROVIDER: Madelin Rear, MD [address] [place],  [place] [address]  PRIMARY PROVIDER:  Arvella Nigh, MD  PRIMARY REASON FOR VISIT:  1. Neoplasm of uncertain behavior of thyroid gland      HISTORY OF PRESENT ILLNESS:   Jennifer Evans, a 60 y.o. female, was seen for a [place] cancer genetics consultation at the request of Dr. Garnet Koyanagi due to a personal history of a thyroid nodule suspicious for medullary thyroid carcinoma.  Jennifer Evans presents to clinic today to discuss the possibility of a hereditary predisposition to cancer, genetic testing, and to further clarify her future cancer risks, as well as potential cancer risks for family members.   Jennifer Evans is a 60 y.o. female with no personal history of cancer. A thyroid nodule was identified in her 48s (2008) that was watched for many years. It has since grown slightly and a biopsy was recently performed that showed follicular neoplasm and was sent to [place]. The Afirma report is not available in patient's chart for review, but reportedly came back suspicious, gene expression positive for medullary thyroid carcinoma.  CANCER HISTORY:  Oncology History   No history exists.     RISK FACTORS:  Menarche was at age 65-13.  First live birth at age 1.  OCP use for approximately 2 years.  Ovaries intact: yes.  Hysterectomy: no.  Menopausal status: postmenopausal.  HRT use: 0 years. Colonoscopy: yes; normal. Mammogram within the last year: yes Number of breast biopsies: lymph node biopsy negative many years ago. Up to date with pelvic exams: yes. 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   Sinus headache    Thyroid disease    hx of thyroid nodule (stable)    Past Surgical History:  Procedure Laterality Date   APPENDECTOMY  2007   benign thyroid nodule removed  1999   BREAST EXCISIONAL BIOPSY Right    COLONOSCOPY  12/2009    Social History   Socioeconomic History   Marital status: Married    Spouse name: Not on file   Number of children: Not on file   Years of education: Not on file   Highest education level: Not on file  Occupational History   Not on file  Tobacco Use   Smoking status: Never   Smokeless tobacco: Never  Vaping Use   Vaping Use: Never used  Substance and Sexual Activity   Alcohol use: Yes    Comment: occasional   Drug use: No   Sexual activity: Not on file  Other Topics Concern   Not on file  Social History Narrative   Not on file   Social Determinants of Health   Financial Resource Strain: Not on file  Food Insecurity: Not on file  Transportation Needs: Not on file  Physical Activity: Not on file  Stress: Not on file  Social Connections: Not on file     FAMILY HISTORY:  We obtained a detailed, 4-generation family history.  Significant diagnoses are listed below: Family History  Problem Relation Age of  Onset   Cancer Mother 29       breast   Breast cancer Mother 14   Stroke Father 8   Dementia Father 91   Colon polyps Brother 85   Skin cancer Brother    Cancer Maternal Uncle        dx 50s-60s   Esophageal cancer Neg Hx    Rectal cancer Neg Hx    Stomach cancer Neg Hx    Jennifer Evans has 1 daughter, 51, and 1 son, 14. Neither have had cancer. She has a brother, 35, who has had skin cancer (squamous cell) and a sister, 78, no history of cancer.  Jennifer Evans father died at 49 of a stroke. Patient had 4 paternal uncles, 1 paternal aunt, no cancer. Paternal grandmother died of old age, grandfather died of surgical complication.  Jennifer Evans mother had breast cancer at 13 and died at  19. Patient had 3 maternal uncles, 1 aunt. One uncle had colon cancer in his 28s-60s, died in his 49s-80s. Maternal grandmother died at 19, grandfather died at 19 or hemorrhage.   No known family history of medullary thyroid cancer, pheochromocytomas or parathyroid adenomas.  Jennifer Evans is unaware of previous family history of genetic testing for hereditary cancer risks. Patient's maternal ancestors are of [place] descent, and paternal ancestors are of Scottish/Irish descent. There is no reported Ashkenazi Jewish ancestry. There is no known consanguinity.    GENETIC COUNSELING ASSESSMENT: Jennifer Evans is a 60 y.o. female with a personal history of a thyroid nodule suspicious for medullary thyroid carcinoma which is somewhat suggestive of a hereditary cancer syndrome and predisposition to cancer. We, therefore, discussed and recommended the following at today's visit.   DISCUSSION: We discussed that approximately 10% of cancer is hereditary. We discussed MEN2 (RET gene), including cancers associated and potential management changes. Approximately 20% of medullary thyroid cancer is hereditary. There are other genes we can test related to other cancer risks that we discussed, but Jennifer Evans is only interested in testing of the RET gene today. We discussed that testing is beneficial for several reasons including knowing about other cancer risks, identifying potential screening and risk-reduction options that may be appropriate, and to understand if other family members could be at risk for cancer and allow them to undergo genetic testing.   We reviewed the characteristics, features and inheritance patterns of hereditary cancer syndromes. We also discussed genetic testing, including the appropriate family members to test, the process of testing, insurance coverage and turn-around-time for results. We discussed the implications of a negative, positive and/or variant of uncertain significant result. We  recommended Jennifer Evans pursue genetic testing for the RET gene.  Based on Jennifer Evans's personal history of molecular testing suggestive of "medullary thyroid carcinoma"  she meets medical criteria for genetic testing. Despite that she meets criteria, she may still have an out of pocket cost. We discussed that if her out of pocket cost for testing is over $100, the laboratory will call and confirm whether she wants to proceed with testing.  If the out of pocket cost of testing is less than $100 she will be billed by the genetic testing laboratory.    PLAN: After considering the risks, benefits, and limitations, Ms. Landen provided informed consent to pursue genetic testing and the blood sample was sent to [facility] for analysis of the RET gene. Results should be available within approximately 2-3 weeks' time, at which point they will be disclosed by telephone to Ms.  Evans, as will any additional recommendations warranted by these results. Jennifer Evans will receive a summary of her genetic counseling visit and a copy of her results once available. This information will also be available in Epic.   Jennifer Evans questions were answered to her satisfaction today. Our contact information was provided should additional questions or concerns arise. Thank you for the referral and allowing [place] to share in the care of your patient.   Faith Rogue, MS, [facility] Genetic Counselor [email or web address] Phone: [phone]  The patient was seen for a total of 45 minutes in face-to-face genetic counseling.  Patient was seen with her husband Shanon Brow, she requests we call him with her test results. Dr. Grayland Ormond was available for discussion regarding this case.   _______________________________________________________________________ For Office Staff:  Number of people involved in session: 2 Was an Intern/ student involved with case: no

## 2021-03-22 ENCOUNTER — Encounter: Payer: BC Managed Care – PPO | Admitting: Genetic Counselor

## 2021-03-22 ENCOUNTER — Other Ambulatory Visit: Payer: BC Managed Care – PPO

## 2021-03-22 DIAGNOSIS — K589 Irritable bowel syndrome without diarrhea: Secondary | ICD-10-CM | POA: Diagnosis not present

## 2021-03-22 DIAGNOSIS — K219 Gastro-esophageal reflux disease without esophagitis: Secondary | ICD-10-CM | POA: Diagnosis not present

## 2021-03-22 DIAGNOSIS — Z1211 Encounter for screening for malignant neoplasm of colon: Secondary | ICD-10-CM | POA: Diagnosis not present

## 2021-03-22 NOTE — Progress Notes (Signed)
Surgical Instructions    Your procedure is scheduled on 03/25/21.  Report to [facility] Main Entrance "A" at 11:30 A.M., then check in with the Admitting office.  Call this number if you have problems the morning of surgery:  [phone]   If you have any questions prior to your surgery date call [phone]: Open Monday-Friday 8am-4pm    Remember:  Do not eat after midnight the night before your surgery  You may drink clear liquids until 10:30 the morning of your surgery.   Clear liquids allowed are: Water, Non-Citrus Juices (without pulp), Carbonated Beverages, Clear Tea, Black Coffee ONLY (NO MILK, CREAM OR POWDERED CREAMER of any kind), and Gatorade    Take these medicines the morning of surgery with A SIP OF WATER: cetirizine (ZYRTEC)  montelukast (SINGULAIR)   If Needed: albuterol (VENTOLIN HFA) budesonide-formoterol (SYMBICORT)  esomeprazole (NEXIUM) fluticasone (FLONASE) oxymetazoline (AFRIN) valACYclovir (VALTREX)   As of today, STOP taking any Aspirin (unless otherwise instructed by your surgeon) Aleve, Naproxen, Ibuprofen, Motrin, Advil, Goody's, BC's, all herbal medications, fish oil, and all vitamins.     After your COVID test   You are not required to quarantine however you are required to wear a well-fitting mask when you are out and around people not in your household.  If your mask becomes wet or soiled, replace with a new one.  Wash your hands often with soap and water for 20 seconds or clean your hands with an alcohol-based hand sanitizer that contains at least 60% alcohol.  Do not share personal items.  Notify your provider: if you are in close contact with someone who has COVID  or if you develop a fever of 100.4 or greater, sneezing, cough, sore throat, shortness of breath or body aches.           Do not wear jewelry or makeup Do not wear lotions, powders, perfumes or deodorant. Do not shave 48 hours prior to surgery.   Do not bring valuables  to the hospital. DO Not wear nail polish, gel polish, artificial nails, or any other type of covering on natural nails including finger and toenails. If patients have artificial nails, gel coating, etc. that need to be removed by a nail salon, please have this removed prior to surgery or surgery may need to be canceled/delayed if the surgeon/ anesthesia feels like the patient is unable to be adequately monitored.             [facility] is not responsible for any belongings or valuables.  Do NOT Smoke (Tobacco/Vaping)  24 hours prior to your procedure  If you use a CPAP at night, you may bring your mask for your overnight stay.   Contacts, glasses, hearing aids, dentures or partials may not be worn into surgery, please bring cases for these belongings   For patients admitted to the hospital, discharge time will be determined by your treatment team.   Patients discharged the day of surgery will not be allowed to drive home, and someone needs to stay with them for 24 hours.  NO VISITORS WILL BE ALLOWED IN PRE-OP WHERE PATIENTS ARE PREPPED FOR SURGERY.  ONLY 1 SUPPORT PERSON MAY BE PRESENT IN THE WAITING ROOM WHILE YOU ARE IN SURGERY.  IF YOU ARE TO BE ADMITTED, ONCE YOU ARE IN YOUR ROOM YOU WILL BE ALLOWED TWO (2) VISITORS. 1 (ONE) VISITOR MAY STAY OVERNIGHT BUT MUST ARRIVE TO THE ROOM BY 8pm.  Minor children may have two parents present. Special  consideration for safety and communication needs will be reviewed on a case by case basis.  Special instructions:    Oral Hygiene is also important to reduce your risk of infection.  Remember - BRUSH YOUR TEETH THE MORNING OF SURGERY WITH YOUR REGULAR TOOTHPASTE   [facility]- Preparing For [facility]  Before surgery, you can play an important role. Because skin is not sterile, your skin needs to be as free of germs as possible. You can reduce the number of germs on your skin by washing with CHG (chlorahexidine gluconate) Soap before surgery.  CHG is an  antiseptic cleaner which kills germs and bonds with the skin to continue killing germs even after washing.     Please do not use if you have an allergy to CHG or antibacterial soaps. If your skin becomes reddened/irritated stop using the CHG.  Do not shave (including legs and underarms) for at least 48 hours prior to first CHG shower. It is OK to shave your face.  Please follow these instructions carefully.     Shower the NIGHT BEFORE SURGERY and the MORNING OF SURGERY with CHG Soap.   If you chose to wash your hair, wash your hair first as usual with your normal shampoo. After you shampoo, rinse your hair and body thoroughly to remove the shampoo.  Then [employer] and genitals (private parts) with your normal soap and rinse thoroughly to remove soap.  After that Use CHG Soap as you would any other liquid soap. You can apply CHG directly to the skin and wash gently with a scrungie or a clean washcloth.   Apply the CHG Soap to your body ONLY FROM THE NECK DOWN.  Do not use on open wounds or open sores. Avoid contact with your eyes, ears, mouth and genitals (private parts). Wash Face and genitals (private parts)  with your normal soap.   Wash thoroughly, paying special attention to the area where your surgery will be performed.  Thoroughly rinse your body with warm water from the neck down.  DO NOT shower/wash with your normal soap after using and rinsing off the CHG Soap.  Pat yourself dry with a CLEAN TOWEL.  Wear CLEAN PAJAMAS to bed the night before surgery  Place CLEAN SHEETS on your bed the night before your surgery  DO NOT SLEEP WITH PETS.   Day of Surgery:  Take a shower with CHG soap. Wear Clean/Comfortable clothing the morning of surgery Do not apply any deodorants/lotions.   Remember to brush your teeth WITH YOUR REGULAR TOOTHPASTE.   Please read over the following fact sheets that you were given.

## 2021-03-23 ENCOUNTER — Other Ambulatory Visit: Payer: Self-pay

## 2021-03-23 ENCOUNTER — Encounter (HOSPITAL_COMMUNITY)
Admission: RE | Admit: 2021-03-23 | Discharge: 2021-03-23 | Disposition: A | Discharge status: Home / Self Care | Payer: BC Managed Care – PPO | Source: Ambulatory Visit | Attending: Surgery | Admitting: Surgery

## 2021-03-23 ENCOUNTER — Encounter (HOSPITAL_COMMUNITY): Payer: Self-pay

## 2021-03-23 VITALS — BP 153/73 | HR 86 | Temp 98.4°F | Resp 17 | Ht 63.0 in | Wt 138.6 lb

## 2021-03-23 DIAGNOSIS — C770 Secondary and unspecified malignant neoplasm of lymph nodes of head, face and neck: Secondary | ICD-10-CM | POA: Diagnosis not present

## 2021-03-23 DIAGNOSIS — Z79899 Other long term (current) drug therapy: Secondary | ICD-10-CM | POA: Diagnosis not present

## 2021-03-23 DIAGNOSIS — Z20822 Contact with and (suspected) exposure to covid-19: Secondary | ICD-10-CM | POA: Insufficient documentation

## 2021-03-23 DIAGNOSIS — Z01818 Encounter for other preprocedural examination: Secondary | ICD-10-CM | POA: Insufficient documentation

## 2021-03-23 DIAGNOSIS — K219 Gastro-esophageal reflux disease without esophagitis: Secondary | ICD-10-CM | POA: Diagnosis not present

## 2021-03-23 DIAGNOSIS — C73 Malignant neoplasm of thyroid gland: Secondary | ICD-10-CM | POA: Diagnosis not present

## 2021-03-23 DIAGNOSIS — I10 Essential (primary) hypertension: Secondary | ICD-10-CM | POA: Diagnosis not present

## 2021-03-23 DIAGNOSIS — D440 Neoplasm of uncertain behavior of thyroid gland: Secondary | ICD-10-CM | POA: Diagnosis not present

## 2021-03-23 DIAGNOSIS — J45909 Unspecified asthma, uncomplicated: Secondary | ICD-10-CM | POA: Diagnosis not present

## 2021-03-23 LAB — CBC
HCT: 42.2 % (ref 36.0–46.0)
Hemoglobin: 14 g/dL (ref 12.0–15.0)
MCH: 31.4 pg (ref 26.0–34.0)
MCHC: 33.2 g/dL (ref 30.0–36.0)
MCV: 94.6 fL (ref 80.0–100.0)
Platelets: 452 10*3/uL — ABNORMAL HIGH (ref 150–400)
RBC: 4.46 MIL/uL (ref 3.87–5.11)
RDW: 13.3 % (ref 11.5–15.5)
WBC: 5.7 10*3/uL (ref 4.0–10.5)
nRBC: 0 % (ref 0.0–0.2)

## 2021-03-23 LAB — BASIC METABOLIC PANEL
Anion gap: 7 (ref 5–15)
BUN: 11 mg/dL (ref 8–23)
CO2: 26 mmol/L (ref 22–32)
Calcium: 9.6 mg/dL (ref 8.9–10.3)
Chloride: 102 mmol/L (ref 98–111)
Creatinine, Ser: 0.75 mg/dL (ref 0.44–1.00)
GFR, Estimated: >60 mL/min (ref >60)
Glucose, Bld: 111 mg/dL — ABNORMAL HIGH (ref 70–99)
Potassium: 3.9 mmol/L (ref 3.5–5.1)
Sodium: 135 mmol/L (ref 135–145)

## 2021-03-23 LAB — SARS CORONAVIRUS 2 (TAT 6-24 HRS): SARS Coronavirus 2: NEGATIVE

## 2021-03-23 NOTE — Progress Notes (Signed)
PCP: Arvella Nigh, MD Cardiologist: denies  EKG: 03/23/21 CXR: na ECHO: 02/24/19 Stress Test: denies Cardiac Cath: denies  Fasting Blood Sugar- na Checks Blood Sugar__na_ times a day  OSA/CPAP: No  ASA/Blood Thinner: No  Covid test 03/23/21 at PAT  Anesthesia Review: No  Patient denies shortness of breath, fever, cough, and chest pain at PAT appointment.  Patient verbalized understanding of instructions provided today at the PAT appointment.  Patient asked to review instructions at home and day of surgery.

## 2021-03-25 ENCOUNTER — Ambulatory Visit (HOSPITAL_COMMUNITY): Payer: BC Managed Care – PPO | Admitting: General Practice

## 2021-03-25 ENCOUNTER — Encounter (HOSPITAL_COMMUNITY): Payer: Self-pay | Admitting: Surgery

## 2021-03-25 ENCOUNTER — Encounter (HOSPITAL_COMMUNITY)
Admission: RE | Disposition: A | Discharge status: Home / Self Care | Payer: Self-pay | Source: Home / Self Care | Attending: Surgery

## 2021-03-25 ENCOUNTER — Ambulatory Visit (HOSPITAL_COMMUNITY)
Admission: RE | Admit: 2021-03-25 | Discharge: 2021-03-26 | Disposition: A | Discharge status: Home / Self Care | Payer: BC Managed Care – PPO | Attending: Surgery | Admitting: Surgery

## 2021-03-25 ENCOUNTER — Other Ambulatory Visit: Payer: Self-pay

## 2021-03-25 DIAGNOSIS — K219 Gastro-esophageal reflux disease without esophagitis: Secondary | ICD-10-CM | POA: Insufficient documentation

## 2021-03-25 DIAGNOSIS — Z79899 Other long term (current) drug therapy: Secondary | ICD-10-CM | POA: Insufficient documentation

## 2021-03-25 DIAGNOSIS — J45909 Unspecified asthma, uncomplicated: Secondary | ICD-10-CM | POA: Diagnosis not present

## 2021-03-25 DIAGNOSIS — D440 Neoplasm of uncertain behavior of thyroid gland: Secondary | ICD-10-CM | POA: Diagnosis not present

## 2021-03-25 DIAGNOSIS — I10 Essential (primary) hypertension: Secondary | ICD-10-CM | POA: Insufficient documentation

## 2021-03-25 DIAGNOSIS — C770 Secondary and unspecified malignant neoplasm of lymph nodes of head, face and neck: Secondary | ICD-10-CM | POA: Diagnosis not present

## 2021-03-25 DIAGNOSIS — C73 Malignant neoplasm of thyroid gland: Secondary | ICD-10-CM | POA: Insufficient documentation

## 2021-03-25 HISTORY — PX: LYMPH NODE DISSECTION: SHX5087

## 2021-03-25 HISTORY — PX: THYROIDECTOMY: SHX17

## 2021-03-25 SURGERY — THYROIDECTOMY
Anesthesia: General | Site: Neck

## 2021-03-25 MED ORDER — FENTANYL CITRATE (PF) 100 MCG/2ML IJ SOLN
25.0000 ug | INTRAMUSCULAR | Status: DC | PRN
  Administered 2021-03-25 (×3): 50 ug via INTRAVENOUS

## 2021-03-25 MED ORDER — IBUPROFEN 600 MG PO TABS
600.0000 mg | ORAL_TABLET | Freq: Four times a day (QID) | ORAL | Status: DC | PRN
  Administered 2021-03-25: 600 mg via ORAL
  Filled 2021-03-25: qty 1

## 2021-03-25 MED ORDER — FENTANYL CITRATE (PF) 250 MCG/5ML IJ SOLN
INTRAMUSCULAR | Status: AC
  Filled 2021-03-25: qty 5

## 2021-03-25 MED ORDER — LIDOCAINE-EPINEPHRINE 1 %-1:100000 IJ SOLN
INTRAMUSCULAR | Status: AC
  Filled 2021-03-25: qty 1

## 2021-03-25 MED ORDER — HYDROMORPHONE HCL 1 MG/ML IJ SOLN
INTRAMUSCULAR | Status: AC
  Filled 2021-03-25: qty 1

## 2021-03-25 MED ORDER — LIDOCAINE 2% (20 MG/ML) 5 ML SYRINGE
INTRAMUSCULAR | Status: DC | PRN
  Administered 2021-03-25: 60 mg via INTRAVENOUS
  Administered 2021-03-25: 40 mg via INTRAVENOUS

## 2021-03-25 MED ORDER — LACTATED RINGERS IV SOLN: INTRAVENOUS | Status: DC

## 2021-03-25 MED ORDER — FENTANYL CITRATE (PF) 250 MCG/5ML IJ SOLN
INTRAMUSCULAR | Status: DC | PRN
  Administered 2021-03-25 (×3): 50 ug via INTRAVENOUS

## 2021-03-25 MED ORDER — PROPOFOL 10 MG/ML IV BOLUS
INTRAVENOUS | Status: AC
  Filled 2021-03-25: qty 20

## 2021-03-25 MED ORDER — PROMETHAZINE HCL 25 MG/ML IJ SOLN
6.2500 mg | INTRAMUSCULAR | Status: DC | PRN
Start: 2021-03-25 — End: 2021-03-25

## 2021-03-25 MED ORDER — HEMOSTATIC AGENTS (NO CHARGE) OPTIME
TOPICAL | Status: DC | PRN
  Administered 2021-03-25: 1 via TOPICAL

## 2021-03-25 MED ORDER — CEFAZOLIN SODIUM-DEXTROSE 2-4 GM/100ML-% IV SOLN
2.0000 g | INTRAVENOUS | Status: AC
  Administered 2021-03-25: 2 g via INTRAVENOUS
  Filled 2021-03-25: qty 100

## 2021-03-25 MED ORDER — ACETAMINOPHEN 500 MG PO TABS
ORAL_TABLET | ORAL | Status: AC
  Administered 2021-03-25: 1000 mg via ORAL
  Filled 2021-03-25: qty 2

## 2021-03-25 MED ORDER — MIDAZOLAM HCL 2 MG/2ML IJ SOLN
INTRAMUSCULAR | Status: AC
  Filled 2021-03-25: qty 2

## 2021-03-25 MED ORDER — ONDANSETRON 4 MG PO TBDP: 4.0000 mg | ORAL_TABLET | Freq: Four times a day (QID) | ORAL | Status: DC | PRN

## 2021-03-25 MED ORDER — ALBUTEROL SULFATE (2.5 MG/3ML) 0.083% IN NEBU
2.5000 mg | INHALATION_SOLUTION | Freq: Four times a day (QID) | RESPIRATORY_TRACT | Status: DC | PRN
  Administered 2021-03-25 – 2021-03-26 (×2): 2.5 mg via RESPIRATORY_TRACT
  Filled 2021-03-25 (×2): qty 3

## 2021-03-25 MED ORDER — ACETAMINOPHEN 325 MG PO TABS: 650.0000 mg | ORAL_TABLET | Freq: Four times a day (QID) | ORAL | Status: DC | PRN

## 2021-03-25 MED ORDER — CALCIUM CARBONATE ANTACID 500 MG PO CHEW: 2.0000 | CHEWABLE_TABLET | Freq: Two times a day (BID) | ORAL | 1 refills | Status: DC

## 2021-03-25 MED ORDER — ORAL CARE MOUTH RINSE: 15.0000 mL | Freq: Once | OROMUCOSAL | Status: AC

## 2021-03-25 MED ORDER — ACETAMINOPHEN 650 MG RE SUPP: 650.0000 mg | Freq: Four times a day (QID) | RECTAL | Status: DC | PRN

## 2021-03-25 MED ORDER — SUGAMMADEX SODIUM 200 MG/2ML IV SOLN
INTRAVENOUS | Status: DC | PRN
  Administered 2021-03-25: 200 mg via INTRAVENOUS

## 2021-03-25 MED ORDER — MONTELUKAST SODIUM 10 MG PO TABS
10.0000 mg | ORAL_TABLET | Freq: Every day | ORAL | Status: DC
  Administered 2021-03-26: 10 mg via ORAL
  Filled 2021-03-25: qty 1

## 2021-03-25 MED ORDER — ONDANSETRON HCL 4 MG/2ML IJ SOLN
4.0000 mg | Freq: Four times a day (QID) | INTRAMUSCULAR | Status: DC | PRN
  Administered 2021-03-25: 4 mg via INTRAVENOUS
  Filled 2021-03-25: qty 2

## 2021-03-25 MED ORDER — FENTANYL CITRATE (PF) 100 MCG/2ML IJ SOLN
INTRAMUSCULAR | Status: AC
  Filled 2021-03-25: qty 2

## 2021-03-25 MED ORDER — CHLORHEXIDINE GLUCONATE 0.12 % MT SOLN
OROMUCOSAL | Status: AC
  Filled 2021-03-25: qty 15

## 2021-03-25 MED ORDER — OXYCODONE HCL 5 MG PO TABS
5.0000 mg | ORAL_TABLET | ORAL | Status: DC | PRN
  Filled 2021-03-25: qty 2

## 2021-03-25 MED ORDER — CHLORHEXIDINE GLUCONATE CLOTH 2 % EX PADS: 6.0000 | MEDICATED_PAD | Freq: Once | CUTANEOUS | Status: DC

## 2021-03-25 MED ORDER — CALCIUM CARBONATE 1250 (500 CA) MG PO TABS
2.0000 | ORAL_TABLET | Freq: Three times a day (TID) | ORAL | Status: DC
  Administered 2021-03-26: 1000 mg via ORAL
  Filled 2021-03-25: qty 1

## 2021-03-25 MED ORDER — ROCURONIUM BROMIDE 10 MG/ML (PF) SYRINGE
PREFILLED_SYRINGE | INTRAVENOUS | Status: DC | PRN
  Administered 2021-03-25: 20 mg via INTRAVENOUS
  Administered 2021-03-25: 50 mg via INTRAVENOUS

## 2021-03-25 MED ORDER — DEXAMETHASONE SODIUM PHOSPHATE 10 MG/ML IJ SOLN
INTRAMUSCULAR | Status: DC | PRN
  Administered 2021-03-25: 10 mg via INTRAVENOUS

## 2021-03-25 MED ORDER — HYDROCHLOROTHIAZIDE 12.5 MG PO CAPS
12.5000 mg | ORAL_CAPSULE | Freq: Every morning | ORAL | Status: DC
  Filled 2021-03-25: qty 1

## 2021-03-25 MED ORDER — ONDANSETRON HCL 4 MG/2ML IJ SOLN
INTRAMUSCULAR | Status: DC | PRN
  Administered 2021-03-25: 4 mg via INTRAVENOUS

## 2021-03-25 MED ORDER — PHENYLEPHRINE 40 MCG/ML (10ML) SYRINGE FOR IV PUSH (FOR BLOOD PRESSURE SUPPORT)
PREFILLED_SYRINGE | INTRAVENOUS | Status: DC | PRN
  Administered 2021-03-25: 40 ug via INTRAVENOUS

## 2021-03-25 MED ORDER — HYDROMORPHONE HCL 1 MG/ML IJ SOLN
1.0000 mg | INTRAMUSCULAR | Status: DC | PRN
  Administered 2021-03-25 (×2): 1 mg via INTRAVENOUS
  Filled 2021-03-25: qty 1

## 2021-03-25 MED ORDER — TRAMADOL HCL 50 MG PO TABS: 50.0000 mg | ORAL_TABLET | Freq: Four times a day (QID) | ORAL | Status: DC | PRN

## 2021-03-25 MED ORDER — LEVOTHYROXINE SODIUM 88 MCG PO TABS
88.0000 ug | ORAL_TABLET | Freq: Every day | ORAL | 3 refills | Status: DC
Start: 2021-03-25 — End: 2023-10-27

## 2021-03-25 MED ORDER — MIDAZOLAM HCL 2 MG/2ML IJ SOLN
INTRAMUSCULAR | Status: DC | PRN
  Administered 2021-03-25: 2 mg via INTRAVENOUS

## 2021-03-25 MED ORDER — PHENYLEPHRINE 40 MCG/ML (10ML) SYRINGE FOR IV PUSH (FOR BLOOD PRESSURE SUPPORT)
PREFILLED_SYRINGE | INTRAVENOUS | Status: AC
  Filled 2021-03-25: qty 10

## 2021-03-25 MED ORDER — ACETAMINOPHEN 500 MG PO TABS: 1000.0000 mg | ORAL_TABLET | Freq: Once | ORAL | Status: AC

## 2021-03-25 MED ORDER — 0.9 % SODIUM CHLORIDE (POUR BTL) OPTIME
TOPICAL | Status: DC | PRN
  Administered 2021-03-25: 500 mL

## 2021-03-25 MED ORDER — SODIUM CHLORIDE 0.45 % IV SOLN: INTRAVENOUS | Status: DC

## 2021-03-25 MED ORDER — CHLORHEXIDINE GLUCONATE 0.12 % MT SOLN
15.0000 mL | Freq: Once | OROMUCOSAL | Status: AC
  Administered 2021-03-25: 15 mL via OROMUCOSAL

## 2021-03-25 MED ORDER — PROPOFOL 10 MG/ML IV BOLUS
INTRAVENOUS | Status: DC | PRN
  Administered 2021-03-25: 140 mg via INTRAVENOUS

## 2021-03-25 MED ORDER — TRAMADOL HCL 50 MG PO TABS: 50.0000 mg | ORAL_TABLET | Freq: Four times a day (QID) | ORAL | 0 refills | Status: DC | PRN

## 2021-03-25 MED ORDER — LOSARTAN POTASSIUM 25 MG PO TABS
25.0000 mg | ORAL_TABLET | Freq: Every day | ORAL | Status: DC
  Administered 2021-03-25 – 2021-03-26 (×2): 25 mg via ORAL
  Filled 2021-03-25 (×2): qty 1

## 2021-03-25 SURGICAL SUPPLY — 50 items
ADH SKN CLS APL DERMABOND .7 (GAUZE/BANDAGES/DRESSINGS) ×1
APL PRP STRL LF DISP 70% ISPRP (MISCELLANEOUS) ×1
BLADE CLIPPER SURG (BLADE) IMPLANT
BLADE SURG 10 STRL SS (BLADE) ×4 IMPLANT
BLADE SURG 15 STRL LF DISP TIS (BLADE) ×2 IMPLANT
BLADE SURG 15 STRL SS (BLADE) ×3
CANISTER SUCT 3000ML PPV (MISCELLANEOUS) ×4 IMPLANT
CHLORAPREP W/TINT 26 (MISCELLANEOUS) ×2 IMPLANT
CLIP TI WIDE RED SMALL 24 (CLIP) ×2 IMPLANT
CLIP VESOCCLUDE MED 24/CT (CLIP) ×4 IMPLANT
CLIP VESOCCLUDE SM WIDE 24/CT (CLIP) ×4 IMPLANT
CLOSURE WOUND 1/2 X4 (GAUZE/BANDAGES/DRESSINGS) ×1
COVER SURGICAL LIGHT HANDLE (MISCELLANEOUS) ×4 IMPLANT
DERMABOND ADVANCED (GAUZE/BANDAGES/DRESSINGS) ×2
DERMABOND ADVANCED .7 DNX12 (GAUZE/BANDAGES/DRESSINGS) IMPLANT
DRAPE LAPAROTOMY 100X72 PEDS (DRAPES) ×4 IMPLANT
DRAPE UTILITY XL STRL (DRAPES) ×4 IMPLANT
ELECT CAUTERY BLADE 6.4 (BLADE) ×4 IMPLANT
ELECT REM PT RETURN 9FT ADLT (ELECTROSURGICAL) ×3
ELECTRODE REM PT RTRN 9FT ADLT (ELECTROSURGICAL) ×2 IMPLANT
GAUZE 4X4 16PLY ~~LOC~~+RFID DBL (SPONGE) ×4 IMPLANT
GAUZE SPONGE 4X4 12PLY STRL (GAUZE/BANDAGES/DRESSINGS) ×4 IMPLANT
GLOVE SURG ORTHO LTX SZ8 (GLOVE) ×4 IMPLANT
GOWN STRL REUS W/ TWL LRG LVL3 (GOWN DISPOSABLE) ×2 IMPLANT
GOWN STRL REUS W/ TWL XL LVL3 (GOWN DISPOSABLE) ×2 IMPLANT
GOWN STRL REUS W/TWL LRG LVL3 (GOWN DISPOSABLE) ×3
GOWN STRL REUS W/TWL XL LVL3 (GOWN DISPOSABLE) ×3
HEMOSTAT SURGICEL 2X4 FIBR (HEMOSTASIS) ×4 IMPLANT
ILLUMINATOR WAVEGUIDE N/F (MISCELLANEOUS) ×2 IMPLANT
KIT BASIN OR (CUSTOM PROCEDURE TRAY) ×4 IMPLANT
KIT TURNOVER KIT B (KITS) ×4 IMPLANT
LIGHT WAVEGUIDE WIDE FLAT (MISCELLANEOUS) IMPLANT
NS IRRIG 1000ML POUR BTL (IV SOLUTION) ×4 IMPLANT
PACK SURGICAL SETUP 50X90 (CUSTOM PROCEDURE TRAY) ×4 IMPLANT
PAD ARMBOARD 7.5X6 YLW CONV (MISCELLANEOUS) ×4 IMPLANT
PENCIL BUTTON HOLSTER BLD 10FT (ELECTRODE) ×4 IMPLANT
POSITIONER HEAD DONUT 9IN (MISCELLANEOUS) ×4 IMPLANT
SHEARS HARMONIC 9CM CVD (BLADE) ×4 IMPLANT
SPECIMEN JAR MEDIUM (MISCELLANEOUS) ×4 IMPLANT
SPONGE INTESTINAL PEANUT (DISPOSABLE) ×4 IMPLANT
STRIP CLOSURE SKIN 1/2X4 (GAUZE/BANDAGES/DRESSINGS) ×3 IMPLANT
SUT MNCRL AB 4-0 PS2 18 (SUTURE) ×4 IMPLANT
SUT SILK 2 0 (SUTURE) ×3
SUT SILK 2-0 18XBRD TIE 12 (SUTURE) ×2 IMPLANT
SUT VIC AB 3-0 SH 18 (SUTURE) ×8 IMPLANT
SYR BULB EAR ULCER 3OZ GRN STR (SYRINGE) ×4 IMPLANT
TOWEL GREEN STERILE (TOWEL DISPOSABLE) ×4 IMPLANT
TOWEL GREEN STERILE FF (TOWEL DISPOSABLE) ×4 IMPLANT
TUBE CONNECTING 12'X1/4 (SUCTIONS) ×1
TUBE CONNECTING 12X1/4 (SUCTIONS) ×3 IMPLANT

## 2021-03-25 NOTE — Anesthesia Preprocedure Evaluation (Signed)
Anesthesia Evaluation  Patient identified by MRN, date of birth, ID band Patient awake    Reviewed: Allergy & Precautions, NPO status , Patient's Chart, lab work & pertinent test results  Airway Mallampati: I  TM Distance: >3 FB Neck ROM: Full    Dental  (+) Teeth Intact, Dental Advisory Given   Pulmonary asthma ,    Pulmonary exam normal breath sounds clear to auscultation       Cardiovascular hypertension, Pt. on medications (-) angina(-) Past MI Normal cardiovascular exam Rhythm:Regular Rate:Normal     Neuro/Psych  Headaches, PSYCHIATRIC DISORDERS Anxiety    GI/Hepatic Neg liver ROS, GERD  Medicated,  Endo/Other  THYROID NEOPLASM OF UNCERTAIN BEHAVIOR  Renal/GU negative Renal ROS     Musculoskeletal  (+) Arthritis ,   Abdominal   Peds  Hematology negative hematology ROS (+)   Anesthesia Other Findings Day of surgery medications reviewed with the patient.  Reproductive/Obstetrics                             Anesthesia Physical Anesthesia Plan  ASA: 2  Anesthesia Plan: General   Post-op Pain Management: Tylenol PO (pre-op)   Induction: Intravenous  PONV Risk Score and Plan: 4 or greater and Midazolam, Dexamethasone and Ondansetron  Airway Management Planned: Oral ETT  Additional Equipment:   Intra-op Plan:   Post-operative Plan: Extubation in OR  Informed Consent: I have reviewed the patients History and Physical, chart, labs and discussed the procedure including the risks, benefits and alternatives for the proposed anesthesia with the patient or authorized representative who has indicated his/her understanding and acceptance.     Dental advisory given  Plan Discussed with: CRNA  Anesthesia Plan Comments:         Anesthesia Quick Evaluation

## 2021-03-25 NOTE — Anesthesia Procedure Notes (Signed)
Procedure Name: Intubation Date/Time: 03/25/2021 2:08 PM Performed by: Ardyth Harps, CRNA Pre-anesthesia Checklist: Patient identified, Emergency Drugs available, Suction available and Patient being monitored Patient Re-evaluated:Patient Re-evaluated prior to induction Oxygen Delivery Method: Circle System Utilized Preoxygenation: Pre-oxygenation with 100% oxygen Induction Type: IV induction Ventilation: Mask ventilation without difficulty Laryngoscope Size: Mac and 3 Grade View: Grade I Tube type: Oral Tube size: 6.5 mm Number of attempts: 1 Airway Equipment and Method: Stylet Placement Confirmation: ETT inserted through vocal cords under direct vision, positive ETCO2 and breath sounds checked- equal and bilateral Secured at: 22 cm Tube secured with: Tape Dental Injury: Teeth and Oropharynx as per pre-operative assessment

## 2021-03-25 NOTE — Op Note (Signed)
Procedure Note  Pre-operative Diagnosis:  thyroid neoplasm of uncertain behavior, rule out medullary thyroid carcinoma  Post-operative Diagnosis:  same  Surgeon:  Armandina Gemma, MD  Assistant:  none   Procedure:  Total thyroidectomy with limited central compartment lymph node dissection  Anesthesia:  General  Estimated Blood Loss:  minimal  Drains: none         Specimen: thyroid and lymph nodes to pathology  Indications:  Patient returns to my practice on referral from Dr. Arvella Nigh for follow-up of thyroid nodules. Patient had been followed for many years with physical exam and sequential ultrasound scanning. She was last evaluated here in 2018. At that time an ultrasound demonstrated a 9 mm hypoechoic nodule in the left thyroid lobe. Biopsy was not indicated and radiology released the patient from further follow-up. Patient has had no prior head or neck surgery. She has never been on thyroid medication. She denies any compressive symptoms. Patient underwent ultrasound on January 31, 2021. This demonstrated a small mildly heterogeneous thyroid gland. The nodule in the left superior pole had increased in size to 1.5 cm. It was solid. It was hypoechoic. It was somewhat lobulated and irregular. It was felt to be moderately suspicious and biopsy was recommended. Patient underwent ultrasound-guided fine-needle aspiration biopsy on February 09, 2021. This demonstrated a follicular neoplasm, Bethesda category IV. Specimen was submitted for molecular genetic testing, AFIRMA, which returned with a result of suspicious, rendering a risk of malignancy of 50%. 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 This gives the patient a risk of medullary thyroid cancer of greater than 99%.  Patient now comes to surgery for thyroidectomy for definitive diagnosis and lymph node sampling.  Procedure Details: Procedure was done in OR #2 at the [facility]. The patient was  brought to the operating room and placed in a supine position on the operating room table. Following administration of general anesthesia, the patient was positioned and then prepped and draped in the usual aseptic fashion. After ascertaining that an adequate level of anesthesia had been achieved, a small Kocher incision was made with #15 blade. Dissection was carried through subcutaneous tissues and platysma.Hemostasis was achieved with the electrocautery. Skin flaps were elevated cephalad and caudad from the thyroid notch to the sternal notch. A Mahorner self-retaining retractor was placed for exposure. Strap muscles were incised in the midline and dissection was begun on the left side.  Strap muscles were reflected laterally.  Left thyroid lobe was small with a dominant firm nodule in the posterior superior pole.  The left lobe was gently mobilized with blunt dissection. Superior pole vessels were dissected out and divided individually between small and medium ligaclips with the harmonic scalpel. The thyroid lobe was rolled anteriorly. Branches of the inferior thyroid artery were divided between small ligaclips with the harmonic scalpel. Inferior venous tributaries were divided between ligaclips. Both the superior and inferior parathyroid glands were identified and preserved on their vascular pedicles. The recurrent laryngeal nerve was identified and preserved along its course. The ligament of Gwenlyn Found was released with the electrocautery and the gland was mobilized onto the anterior trachea. Isthmus was mobilized across the midline. There was a small pyramidal lobe present which was resected with the isthmus. Dry pack was placed in the left neck.  The right thyroid lobe was gently mobilized with blunt dissection. Right thyroid lobe was small without significant nodules. Superior pole vessels were dissected out and divided between small and medium ligaclips with the  Harmonic scalpel. Superior parathyroid was  identified and preserved. Inferior venous tributaries were divided between medium ligaclips with the harmonic scalpel. The right thyroid lobe was rolled anteriorly and the branches of the inferior thyroid artery divided between small ligaclips. The right recurrent laryngeal nerve was identified and preserved along its course. The ligament of Gwenlyn Found was released with the electrocautery. The right thyroid lobe was mobilized onto the anterior trachea and the remainder of the thyroid was dissected off the anterior trachea and the thyroid was completely excised. A suture was used to mark the left lobe. The entire thyroid gland was submitted to pathology for review.  Tissue from the central compartment below the thyroid was dissected out using the harmonic scalpel and the electrocautery.  Ligaclips were used for small veins.  Tissue was dissected from between the carotids and anterior to the trachea and submitted to pathology labelled as central compartment lymph nodes, Zone VI.  The neck was irrigated with warm saline. Fibrillar was placed throughout the operative field. Strap muscles were approximated in the midline with interrupted 3-0 Vicryl sutures. Platysma was closed with interrupted 3-0 Vicryl sutures. Skin was closed with a running 4-0 Monocryl subcuticular suture. Wound was washed and Dermabond was applied. The patient was awakened from anesthesia and brought to the recovery room. The patient tolerated the procedure well.   Armandina Gemma, MD [facility] Office: [phone]

## 2021-03-25 NOTE — Transfer of Care (Signed)
Immediate Anesthesia Transfer of Care Note  Patient: Jennifer Evans  Procedure(s) Performed: TOTAL THYROIDECTOMY (Neck) LIMITED LYMPH NODE DISSECTION (Neck)  Patient Location: PACU  Anesthesia Type:General  Level of Consciousness: awake and drowsy  Airway & Oxygen Therapy: Patient Spontanous Breathing  Post-op Assessment: Report given to RN and Post -op Vital signs reviewed and stable  Post vital signs: Reviewed and stable  Last Vitals:  Vitals Value Taken Time  BP 144/86 03/25/21 1548  Temp    Pulse 94 03/25/21 1553  Resp 10 03/25/21 1553  SpO2 99 % 03/25/21 1553  Vitals shown include unvalidated device data.  Last Pain:  Vitals:   03/25/21 1553  TempSrc:   PainSc: 7       Patients Stated Pain Goal: 3 (34/03/70 9643)  Complications: No notable events documented.

## 2021-03-25 NOTE — Discharge Instructions (Signed)
[facility] - Dr. Althea Backs  THYROID & PARATHYROID SURGERY:  POST-OP INSTRUCTIONS  Always review the instruction sheet provided by the hospital nurse at discharge.  A prescription for pain medication may be sent to your pharmacy at the time of discharge.  Take your pain medication as prescribed.  If narcotic pain medicine is not needed, then you may take acetaminophen (Tylenol) or ibuprofen (Advil) as needed for pain or soreness.  Take your normal home medications as prescribed unless otherwise directed.  If you need a refill on your pain medication, please contact the office during regular business hours.  Prescriptions will not be processed by the office after 5:00PM or on weekends.  Start with a light diet upon arrival home, such as soup and crackers or toast.  Be sure to drink plenty of fluids.  Resume your normal diet the day after surgery.  Most patients will experience some swelling and bruising on the chest and neck area.  Ice packs will help for the first 48 hours after arriving home.  Swelling and bruising will take several days to resolve.   It is common to experience some constipation after surgery.  Increasing fluid intake and taking a stool softener (Colace) will usually help to prevent this problem.  A mild laxative (Milk of Magnesia or Miralax) should be taken according to package directions if there has been no bowel movement after 48 hours.  Dermabond glue covers your incision. This seals the wound and you may shower at any time. The Dermabond will remain in place for about a week.  You may gradually remove the glue when it loosens around the edges.  If you need to loosen the Dermabond for removal, apply a layer of Vaseline to the wound for 15 minutes and then remove with a Kleenex. Your sutures are under the skin and will not show - they will dissolve on their own.  You may resume light daily activities beginning the day after discharge (such as self-care,  walking, climbing stairs), gradually increasing activities as tolerated. You may have sexual intercourse when it is comfortable. Refrain from any heavy lifting or straining until approved by your doctor. You may drive when you no longer are taking prescription pain medication, you can comfortably wear a seatbelt, and you can safely maneuver your car and apply the brakes.  You will see your doctor in the office for a follow-up appointment approximately three weeks after your surgery.  Make sure that you call for this appointment within a day or two after you arrive home to insure a convenient appointment time. Please have any requested laboratory tests performed a few days prior to your office visit so that the results will be available at your follow up appointment.  WHEN TO CALL THE [facility] OFFICE: -- Fever greater than 101.5 -- Inability to urinate -- Nausea and/or vomiting - persistent -- Extreme swelling or bruising -- Continued bleeding from incision -- Increased pain, redness, or drainage from the incision -- Difficulty swallowing or breathing -- Muscle cramping or spasms -- Numbness or tingling in hands or around lips  The clinic staff is available to answer your questions during regular business hours.  Please don't hesitate to call and ask to speak to one of the nurses if you have concerns.  [facility] OFFICE: [phone] (24 hours)  Please sign up for MyChart accounts. This will allow you to communicate directly with my nurse or myself without having to call the office. It will also allow you   to view your test results. You will need to enroll in MyChart for my office ([facility]) and for the hospital ([facility]).  Jadon Harbaugh, MD [facility] practice 

## 2021-03-25 NOTE — Interval H&P Note (Signed)
History and Physical Interval Note:  03/25/2021 1:18 PM  Jennifer Evans  has presented today for surgery, with the diagnosis of THYROID NEOPLASM OF UNCERTAIN BEHAVIOR.  The various methods of treatment have been discussed with the patient and family. After consideration of risks, benefits and other options for treatment, the patient has consented to    Procedure(s): TOTAL THYROIDECTOMY (N/A) LIMITED LYMPH NODE DISSECTION (N/A) as a surgical intervention.    The patient's history has been reviewed, patient examined, no change in status, stable for surgery.  I have reviewed the patient's chart and labs.  Questions were answered to the patient's satisfaction.    Armandina Gemma, [facility] Office: [place]

## 2021-03-26 DIAGNOSIS — Z79899 Other long term (current) drug therapy: Secondary | ICD-10-CM | POA: Diagnosis not present

## 2021-03-26 DIAGNOSIS — C770 Secondary and unspecified malignant neoplasm of lymph nodes of head, face and neck: Secondary | ICD-10-CM | POA: Diagnosis not present

## 2021-03-26 DIAGNOSIS — I10 Essential (primary) hypertension: Secondary | ICD-10-CM | POA: Diagnosis not present

## 2021-03-26 DIAGNOSIS — J45909 Unspecified asthma, uncomplicated: Secondary | ICD-10-CM | POA: Diagnosis not present

## 2021-03-26 DIAGNOSIS — K219 Gastro-esophageal reflux disease without esophagitis: Secondary | ICD-10-CM | POA: Diagnosis not present

## 2021-03-26 DIAGNOSIS — C73 Malignant neoplasm of thyroid gland: Secondary | ICD-10-CM | POA: Diagnosis not present

## 2021-03-26 DIAGNOSIS — D440 Neoplasm of uncertain behavior of thyroid gland: Secondary | ICD-10-CM | POA: Diagnosis not present

## 2021-03-26 LAB — BASIC METABOLIC PANEL
Anion gap: 8 (ref 5–15)
BUN: 18 mg/dL (ref 8–23)
CO2: 22 mmol/L (ref 22–32)
Calcium: 8.9 mg/dL (ref 8.9–10.3)
Chloride: 104 mmol/L (ref 98–111)
Creatinine, Ser: 0.81 mg/dL (ref 0.44–1.00)
GFR, Estimated: >60 mL/min (ref >60)
Glucose, Bld: 133 mg/dL — ABNORMAL HIGH (ref 70–99)
Potassium: 4.5 mmol/L (ref 3.5–5.1)
Sodium: 134 mmol/L — ABNORMAL LOW (ref 135–145)

## 2021-03-26 MED ORDER — ACETAMINOPHEN 325 MG PO TABS: 650.0000 mg | ORAL_TABLET | Freq: Four times a day (QID) | ORAL | Status: DC | PRN

## 2021-03-26 NOTE — Progress Notes (Signed)
Jennifer Evans Lindsayto be D/C'd per MD order. Discussed with the patient and all questions fully answered. ? VSS, Skin clean, dry and intact without evidence of skin break down, no evidence of skin tears noted. ? IV catheter discontinued intact. Site without signs and symptoms of complications. Dressing and pressure applied. ? An After Visit Summary was printed and given to the patient. Patient informed where to pickup prescriptions. ? D/c education completed with patient/family including follow up instructions, medication list, d/c activities limitations if indicated, with other d/c instructions as indicated by MD - patient able to verbalize understanding, all questions fully answered.  ? Patient instructed to return to ED, call 911, or call MD for any changes in condition.  ? Patient to be D/C home via private auto.

## 2021-03-26 NOTE — Discharge Summary (Signed)
[facility] Discharge Summary   Patient ID: Jennifer Evans MRN: [id number] DOB/AGE: [birth year] 61 y.o.  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  Procedures Dr. Armandina Gemma (03/25/21) - Total thyroidectomy with limited central compartment lymph node dissection  Hospital Course:  Patient is a 60 year old female who presented to [facility] with thyroid nodule for thyroidectomy.   Patient was admitted and underwent procedure listed above.  Tolerated procedure well and was transferred to the floor.  Diet was advanced as tolerated.  On POD1, the patient was voiding well, tolerating diet, ambulating well, pain well controlled, vital signs stable, incisions c/d/i and felt stable for discharge home.  Patient will follow up in our office in 2 weeks and knows to call with questions or concerns. She will call to confirm appointment date/time.    Physical Exam: General:  Alert, NAD, pleasant, comfortable Neck: incision c/d/I, minimal edema, some hoarseness CV: RRR Lungs: CTAB, normal effort   I or a member of my team have reviewed this patient in the Controlled Substance Database.   Allergies as of 03/26/2021       Reactions   Ketamine Other (See Comments)   Hallucinations, heart stress         Medication List     TAKE these medications    acetaminophen 325 MG  tablet Commonly known as: TYLENOL Take 2 tablets (650 mg total) by mouth every 6 (six) hours as needed for mild pain (or Fever >/= 101).   ADULT GUMMY PO Take 2 capsules by mouth daily.   AeroChamber [employer] 1 Device by Does not apply route as directed.   albuterol 108 (90 Base) MCG/ACT inhaler Commonly known as: VENTOLIN HFA Inhale 2 puffs into the lungs every 6 (six) hours as needed for wheezing or shortness of breath.   B-12 PO Take 1 tablet by mouth daily as needed (energy).   bismuth subsalicylate 564 PP/29JJ suspension Commonly known as: PEPTO BISMOL Take 30 mLs by mouth every 6 (six) hours as needed.   budesonide-formoterol 160-4.5 MCG/ACT inhaler Commonly known as: Symbicort Inhale 2 puffs into the lungs 2 (two) times daily. What changed:  when to take this reasons to take this   calcium carbonate 500 MG chewable tablet Commonly known as: Tums Chew 2 tablets (400 mg of elemental calcium total) by mouth 2 (two) times daily.   cetirizine 10 MG tablet Commonly known as: ZYRTEC Take 10 mg by mouth daily.   esomeprazole 20 MG capsule Commonly known as: NEXIUM Take 20 mg by mouth daily as needed (acid reflux).   fluticasone 50 MCG/ACT nasal spray Commonly known as: FLONASE Place 2 sprays into both nostrils 2 (two) times daily as needed for allergies.   hydrochlorothiazide 12.5 MG capsule Commonly known as: MICROZIDE Take 12.5 mg by mouth every morning.   ibuprofen 200 MG tablet Commonly known as: ADVIL Take 400-600 mg by mouth every 6 (six) hours as needed  for headache or moderate pain.   levothyroxine 88 MCG tablet Commonly known as: Synthroid Take 1 tablet (88 mcg total) by mouth daily before breakfast.   losartan 25 MG tablet Commonly known as: COZAAR Take 25 mg by mouth daily.   montelukast 10 MG tablet Commonly known as: SINGULAIR Take 10 mg by mouth daily.   oxymetazoline 0.05 % nasal spray Commonly known as: AFRIN Place 1 spray into  both nostrils 2 (two) times daily as needed for congestion.   ROLAIDS PO Take 2-3 tablets by mouth daily as needed (acid reflux).   traMADol 50 MG tablet Commonly known as: ULTRAM Take 1-2 tablets (50-100 mg total) by mouth every 6 (six) hours as needed for moderate pain or severe pain.   valACYclovir 1000 MG tablet Commonly known as: VALTREX Take 1,000 mg by mouth daily as needed (fever blisters).          Follow-up Information     Armandina Gemma, MD. Schedule an appointment as soon as possible for a visit in 3 week(s).   Specialty: General Surgery Why: For wound re-check Contact information: [place] [address] [phone]                 Signed: Norm Parcel , [facility] 03/26/2021, 8:54 AM Please see Amion for pager number during day hours 7:00am-4:30pm

## 2021-03-27 LAB — CEA: CEA: 6.1 ng/mL — ABNORMAL HIGH (ref 0.0–4.7)

## 2021-03-27 NOTE — Anesthesia Postprocedure Evaluation (Signed)
Anesthesia Post Note  Patient: FIORELA PELZER  Procedure(s) Performed: TOTAL THYROIDECTOMY (Neck) LIMITED LYMPH NODE DISSECTION (Neck)     Patient location during evaluation: PACU Anesthesia Type: General Level of consciousness: awake and alert Pain management: pain level controlled Vital Signs Assessment: post-procedure vital signs reviewed and stable Respiratory status: spontaneous breathing, nonlabored ventilation and respiratory function stable Cardiovascular status: blood pressure returned to baseline and stable Postop Assessment: no apparent nausea or vomiting Anesthetic complications: no   No notable events documented.  Last Vitals:  Vitals:   03/26/21 0454 03/26/21 0806  BP:  135/67  Pulse: 82 86  Resp: 16 18  Temp:  36.7 C  SpO2: 98% 97%    Last Pain:  Vitals:   03/26/21 0806  TempSrc: Oral  PainSc:                  Catalina Gravel

## 2021-03-28 ENCOUNTER — Encounter (HOSPITAL_COMMUNITY): Payer: Self-pay

## 2021-03-28 ENCOUNTER — Encounter (HOSPITAL_COMMUNITY): Payer: Self-pay | Admitting: Surgery

## 2021-03-30 LAB — SURGICAL PATHOLOGY

## 2021-03-31 DIAGNOSIS — J4540 Moderate persistent asthma, uncomplicated: Secondary | ICD-10-CM | POA: Diagnosis not present

## 2021-03-31 DIAGNOSIS — F419 Anxiety disorder, unspecified: Secondary | ICD-10-CM | POA: Diagnosis not present

## 2021-04-12 ENCOUNTER — Telehealth: Payer: Self-pay | Admitting: Licensed Clinical Social Worker

## 2021-04-13 DIAGNOSIS — F419 Anxiety disorder, unspecified: Secondary | ICD-10-CM | POA: Diagnosis not present

## 2021-04-13 DIAGNOSIS — R490 Dysphonia: Secondary | ICD-10-CM | POA: Diagnosis not present

## 2021-04-20 DIAGNOSIS — Z9889 Other specified postprocedural states: Secondary | ICD-10-CM

## 2021-04-21 DIAGNOSIS — I10 Essential (primary) hypertension: Secondary | ICD-10-CM | POA: Diagnosis not present

## 2021-04-21 DIAGNOSIS — F419 Anxiety disorder, unspecified: Secondary | ICD-10-CM | POA: Diagnosis not present

## 2021-04-21 DIAGNOSIS — J4540 Moderate persistent asthma, uncomplicated: Secondary | ICD-10-CM | POA: Diagnosis not present

## 2021-04-21 DIAGNOSIS — J040 Acute laryngitis: Secondary | ICD-10-CM | POA: Diagnosis not present

## 2021-04-26 ENCOUNTER — Encounter: Payer: Self-pay | Admitting: Licensed Clinical Social Worker

## 2021-04-26 ENCOUNTER — Ambulatory Visit: Payer: Self-pay | Admitting: Licensed Clinical Social Worker

## 2021-04-26 DIAGNOSIS — Z1379 Encounter for other screening for genetic and chromosomal anomalies: Secondary | ICD-10-CM

## 2021-04-26 NOTE — Progress Notes (Signed)
HPI:  Ms. Glace was previously seen in the [facility] due to a personal history of thyroid cancer and concerns regarding a hereditary predisposition to cancer. Please refer to our prior cancer genetics clinic note for more information regarding our discussion, assessment and recommendations, at the time. Ms. Cuddeback recent genetic test results were disclosed to her, as were recommendations warranted by these results. These results and recommendations are discussed in more detail below.  CANCER HISTORY:  Oncology History   No history exists.    FAMILY HISTORY:  We obtained a detailed, 4-generation family history.  Significant diagnoses are listed below: Family History  Problem Relation Age of Onset   Cancer Mother 60       breast   Breast cancer Mother 56   Stroke Father 49   Dementia Father 26   Colon polyps Brother 87   Skin cancer Brother    Cancer Maternal Uncle        dx 50s-60s   Esophageal cancer Neg Hx    Rectal cancer Neg Hx    Stomach cancer Neg Hx    Ms. Flammia has 1 daughter, 15, and 1 son, 56. Neither have had cancer. She has a brother, 82, who has had skin cancer (squamous cell) and a sister, 77, no history of cancer.   Ms. Vogl father died at 17 of a stroke. Patient had 4 paternal uncles, 1 paternal aunt, no cancer. Paternal grandmother died of old age, grandfather died of surgical complication.   Ms. Jernberg mother had breast cancer at 66 and died at 55. Patient had 3 maternal uncles, 1 aunt. One uncle had colon cancer in his 3s-60s, died in his 25s-80s. Maternal grandmother died at 21, grandfather died at 71 or hemorrhage.    No known family history of medullary thyroid cancer, pheochromocytomas or parathyroid adenomas.   Ms. Sampley is unaware of previous family history of genetic testing for hereditary cancer risks. Patient's maternal ancestors are of [place] descent, and paternal ancestors are of Scottish/Irish descent.  There is no reported Ashkenazi Jewish ancestry. There is no known consanguinity.    GENETIC TEST RESULTS: Genetic testing reported out on 04/12/2021 through the [facility] RET gene test found no pathogenic mutations.    The test report has been scanned into EPIC and is located under the Molecular Pathology section of the Results Review tab.  A portion of the result report is included below for reference.     We discussed that because current genetic testing is not perfect, it is possible there may be a gene mutation in one of these genes that current testing cannot detect, but that chance is small.  There could be another gene that has not yet been discovered, or that we have not yet tested, that is responsible for the cancer diagnoses in the family. It is also possible there is a hereditary cause for the cancer in the family that Ms. Chaudhary did not inherit and therefore was not identified in her testing.  Therefore, it is important to remain in touch with cancer genetics in the future so that we can continue to offer Ms. Whitacre the most up to date genetic testing.   ADDITIONAL GENETIC TESTING: We discussed with Ms. Finck that there are other genes that are associated with increased cancer risk that can be analyzed. Should Ms. Nations wish to pursue additional genetic testing, we are happy to discuss and coordinate this testing, at any time.    CANCER SCREENING  RECOMMENDATIONS: Ms. Brunn test result is considered negative (normal).  This means that we have not identified a hereditary cause for her  personal and family history of cancer at this time. Most cancers happen by chance and this negative test suggests that her cancer may fall into this category.    While reassuring, this does not definitively rule out a hereditary predisposition to cancer. It is still possible that there could be genetic mutations that are undetectable by current technology. There could be genetic mutations in  genes that have not been tested or identified to increase cancer risk.  Therefore, it is recommended she continue to follow the cancer management and screening guidelines provided by her oncology and primary healthcare provider.   An individual's cancer risk and medical management are not determined by genetic test results alone. Overall cancer risk assessment incorporates additional factors, including personal medical history, family history, and any available genetic information that may result in a personalized plan for cancer prevention and surveillance.  RECOMMENDATIONS FOR FAMILY MEMBERS:  Relatives in this family might be at some increased risk of developing cancer, over the general population risk, simply due to the family history of cancer.  We recommended female relatives in this family have a yearly mammogram beginning at age 32, or 62 years younger than the earliest onset of cancer, an annual clinical breast exam, and perform monthly breast self-exams. Female relatives in this family should also have a gynecological exam as recommended by their primary provider.  All family members should be referred for colonoscopy starting at age 42.   FOLLOW-UP: Lastly, we discussed with Ms. Drolet that cancer genetics is a rapidly advancing field and it is possible that new genetic tests will be appropriate for her and/or her family members in the future. We encouraged her to remain in contact with cancer genetics on an annual basis so we can update her personal and family histories and let her know of advances in cancer genetics that may benefit this family.   Our contact number was provided. Ms. Dragos questions were answered to her satisfaction, and she knows she is welcome to call us at anytime with additional questions or concerns.   Faith Rogue, MS, [facility] Genetic Counselor [email or web address] Phone: [phone]

## 2021-04-26 NOTE — Telephone Encounter (Signed)
Revealed negative genetic testing for the RET gene. This normal result is reassuring and indicates that it is unlikely Jennifer Evans's cancer is due to a hereditary cause.  It is unlikely that there is an increased risk of another cancer due to a mutation in one of these genes.  However, genetic testing is not perfect, and cannot definitively rule out a hereditary cause.  It will be important for her to keep in contact with genetics to learn if any additional testing may be needed in the future.

## 2021-05-06 ENCOUNTER — Ambulatory Visit: Payer: BC Managed Care – PPO | Admitting: Nurse Practitioner

## 2021-05-06 ENCOUNTER — Ambulatory Visit (INDEPENDENT_AMBULATORY_CARE_PROVIDER_SITE_OTHER): Payer: BC Managed Care – PPO

## 2021-05-06 ENCOUNTER — Other Ambulatory Visit: Payer: Self-pay

## 2021-05-06 ENCOUNTER — Encounter: Payer: Self-pay | Admitting: Nurse Practitioner

## 2021-05-06 VITALS — BP 118/78 | HR 80 | Temp 98.3°F | Ht 63.0 in | Wt 139.6 lb

## 2021-05-06 DIAGNOSIS — J302 Other seasonal allergic rhinitis: Secondary | ICD-10-CM | POA: Diagnosis not present

## 2021-05-06 DIAGNOSIS — J4521 Mild intermittent asthma with (acute) exacerbation: Secondary | ICD-10-CM

## 2021-05-06 DIAGNOSIS — R490 Dysphonia: Secondary | ICD-10-CM

## 2021-05-06 DIAGNOSIS — J4531 Mild persistent asthma with (acute) exacerbation: Secondary | ICD-10-CM | POA: Diagnosis not present

## 2021-05-06 DIAGNOSIS — R058 Other specified cough: Secondary | ICD-10-CM

## 2021-05-06 DIAGNOSIS — J45901 Unspecified asthma with (acute) exacerbation: Secondary | ICD-10-CM | POA: Insufficient documentation

## 2021-05-06 LAB — POCT EXHALED NITRIC OXIDE: FeNO level (ppb): 42

## 2021-05-06 MED ORDER — PREDNISONE 10 MG PO TABS: ORAL_TABLET | ORAL | 0 refills | Status: DC

## 2021-05-06 MED ORDER — AZITHROMYCIN 250 MG PO TABS: ORAL_TABLET | ORAL | 0 refills | Status: DC

## 2021-05-06 NOTE — Assessment & Plan Note (Signed)
Started after thyroidectomy and being intubated. Minimal improvement since. Advised to avoid throat clearing as much as possible. Use throat lozenges. Referral to ENT for further evaluation.

## 2021-05-06 NOTE — Assessment & Plan Note (Addendum)
Given symptoms and elevation in FeNO, will tx for asthmatic bronchitis flare with another prednisone taper and z pack. Suspect her cough is multifactorial related to upper airway irritation, given her hoarseness, and asthma flare. Continue Symbicort Twice daily and PRN albuterol. CXR clear today. Need to repeat CBC with diff and IgE once off prednisone to evaluate eosinophils and phenotype.   Patient Instructions  -Continue Symbicort 160 2 puffs Twice daily with spacer. Brush tongue and rinse mouth afterwards -Continue Albuterol inhaler 2 puffs or 3 mL neb every 6 hours as needed for shortness of breath or wheezing. Notify if symptoms persist despite rescue inhaler/neb use. -Continue Zyrtec 10 mg daily -Continue flonase nasal spray 2 puffs Twice daily  -Continue Singulair 10 mg At bedtime   -Prednisone taper. 4 tabs for 3 days, then 3 tabs for 3 days, 2 tabs for 3 days, then 1 tab for 3 days, then stop. Take in AM with food. -Z pack. Take 2 tabs on day one followed by 1 tab for four days for a total of 5 days. Notify immediately of any rash, itching, hives, or swelling, or seek emergency care.Finish your antibiotics in their entirety. Do not stop just because symptoms improve. Take with food to reduce GI upset.  Referral placed to ENT.  Follow up in one month with Dr. Valeta Harms or Alanson Aly. If symptoms do not improve or worsen, please contact office for sooner follow up or seek emergency care.

## 2021-05-06 NOTE — Progress Notes (Signed)
@Patient  ID: Jennifer Evans, female    DOB: Mar 29, 1961, 61 y.o.   MRN: [id number]  Chief Complaint  Patient presents with   Follow-up    She reports that she has some green sputum worse in last month.    Referring provider: Arvella Nigh, MD  HPI: 61 year old female, never smoker followed for mild intermittent asthma and allergic rhinitis.  She is a patient Dr. Juline Patch and last seen in office on 11/08/2018 by Volanda Napoleon NP.  Recent diagnosis of thyroid neoplasm status post total thyroidectomy with lymph node dissection in 04/2021.  Past medical history significant for GERD and eczema.  TEST/EVENTS:  11/29/2017 PFTs: FVC 3.34 (101), FEV1 2.5 (97), ratio 79, TLC 95%, DLCO 108% 02/24/2019 echocardiogram: EF 60-65%. Trace MVR. Trivial tricuspid regurg.   11/08/2018: [place] with Volanda Napoleon NP for acute allergy symptoms and cough.  Given prednisone burst by PCP with improvement in symptoms.  Advised to resume Symbicort 2 puffs twice daily, Flonase and omeprazole.  CXR clear.  Supportive care.  05/06/2021: Today - acute visit Patient presents today with her husband for persistent cough and hoarseness. Her hoarseness started 6 weeks ago after she underwent total thyroidectomy for thyroid neoplasm. She was cleared by the surgeon and not found to have any complications from a surgical standpoint; however, she has experienced minimal improvement of the hoarseness. She also has had a persistent, primarily dry hacking cough for the past month. She does produce sputum which is clear to white in the mornings. She has noticed an occasional wheeze but is unsure if it is coming from her lungs or upper airway. She denies shortness of breath, difficulties swallowing, orthopnea, PND, or chest pain. She has not had any increased swelling where her incision site is. She was recently treated with a prednisone taper by her PCP for suspected asthma flare. She continues on Symbicort 160 2 puffs Twice daily and occasionally uses her rescue  albuterol, but hasn't noticed a huge relief in her symptoms with it. She continues on singulair nightly.   FeNO 41 ppb  Allergies  Allergen Reactions   Ketamine Other (See Comments)    Hallucinations, heart stress     Immunization History  Administered Date(s) Administered   Influenza,inj,Quad PF,6+ Mos 01/18/2017, 02/07/2019   Influenza-Unspecified 02/08/2021   Janssen (J&J) SARS-COV-2 Vaccination 11/21/2019   Tdap 12/04/2013   Zoster, Live 12/26/2019, 07/29/2020    Past Medical History:  Diagnosis Date   Allergy    seasonal allergies   Anxiety    Arthritis    Contact lens/glasses fitting    Eczema    Family history of breast cancer    GERD (gastroesophageal reflux disease)    Hypertension    Kidney stones    Menopause    Nasal congestion    Numbness    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     albuterol (VENTOLIN HFA) 108 (90 Base) MCG/ACT inhaler Inhale 2 puffs into the lungs every 6 (six) hours as needed for wheezing or shortness of breath.  bismuth subsalicylate (PEPTO BISMOL) 262 MG/15ML suspension Take 30 mLs by mouth every 6 (six) hours as needed.     budesonide-formoterol (SYMBICORT) 160-4.5 MCG/ACT inhaler Inhale 2 puffs into the lungs 2 (two) times daily. (Patient taking differently: Inhale 2 puffs into the lungs 2 (two) times daily as needed (shortness of breath).) 1 Inhaler 0   Ca Carbonate-Mag Hydroxide (ROLAIDS PO) Take 2-3 tablets by mouth daily as needed (acid reflux).     calcium carbonate (TUMS) 500 MG chewable tablet Chew 2 tablets  (400 mg of elemental calcium total) by mouth 2 (two) times daily. 90 tablet 1   cetirizine (ZYRTEC) 10 MG tablet Take 10 mg by mouth daily.     Cyanocobalamin (B-12 PO) Take 1 tablet by mouth daily as needed (energy).     esomeprazole (NEXIUM) 20 MG capsule Take 20 mg by mouth daily as needed (acid reflux).     fluticasone (FLONASE) 50 MCG/ACT nasal spray Place 2 sprays into both nostrils 2 (two) times daily as needed for allergies.     hydrochlorothiazide (MICROZIDE) 12.5 MG capsule Take 12.5 mg by mouth every morning.     ibuprofen (ADVIL) 200 MG tablet Take 400-600 mg by mouth every 6 (six) hours as needed for headache or moderate pain.     levothyroxine (SYNTHROID) 88 MCG tablet Take 1 tablet (88 mcg total) by mouth daily before breakfast. 30 tablet 3   losartan (COZAAR) 25 MG tablet Take 25 mg by mouth daily.     montelukast (SINGULAIR) 10 MG tablet Take 10 mg by mouth daily.     Multiple Vitamins-Minerals (ADULT GUMMY PO) Take 2 capsules by mouth daily.     oxymetazoline (AFRIN) 0.05 % nasal spray Place 1 spray into both nostrils 2 (two) times daily as needed for congestion.     Spacer/Aero-Holding Chambers (AEROCHAMBER MINI CHAMBER) DEVI 1 Device by Does not apply route as directed. 1 Device 0   traMADol (ULTRAM) 50 MG tablet Take 1-2 tablets (50-100 mg total) by mouth every 6 (six) hours as needed for moderate pain or severe pain. 15 tablet 0   valACYclovir (VALTREX) 1000 MG tablet Take 1,000 mg by mouth daily as needed (fever blisters).     No facility-administered medications prior to visit.     Review of Systems:   Constitutional: No weight loss or gain, night sweats, fevers, chills, fatigue, or lassitude. HEENT: No headaches, difficulty swallowing, tooth/dental problems, or sore throat. No sneezing, itching, ear ache, nasal congestion, or post nasal drip +hoarseness CV:  No chest pain, orthopnea, PND, swelling in lower extremities, anasarca, dizziness, palpitations, syncope Resp:  +primarily non-productive cough with sputum production in AM (clear/white); occasional wheeze. No shortness of breath with exertion or at rest. No excess mucus or change in color of mucus.  No hemoptysis.  No chest wall deformity GI:  No heartburn, indigestion, abdominal pain, nausea, vomiting, diarrhea, change in bowel habits, loss of appetite, bloody stools.  GU: No dysuria, change in color of urine, urgency or frequency.  No flank pain, no hematuria  Skin: No rash, lesions, ulcerations MSK:  No joint pain or swelling.  No decreased range of motion.  No back pain. Neuro: No dizziness or lightheadedness.  Psych: No depression or anxiety. Mood stable.     Physical Exam:  BP 118/78 (BP Location: Left Arm, Patient Position: Sitting, Cuff Size: Normal)    Pulse 80    Temp 98.3 F (36.8 C) (Oral)    Ht 5' 3"  (1.6 m)  Wt 139 lb 9.6 oz (63.3 kg)    SpO2 98%    BMI 24.73 kg/m   GEN: Pleasant, interactive, well-nourished; in no acute distress. HEENT:  Normocephalic and atraumatic. EACs patent bilaterally. TM pearly gray with present light reflex bilaterally. PERRLA. Sclera white. Nasal turbinates pink, moist and patent bilaterally. No rhinorrhea present. Oropharynx pink and moist, without exudate or edema. No lesions, ulcerations, or postnasal drip. Hoarse voice NECK:  Supple w/ fair ROM. No JVD present. Normal carotid impulses w/o bruits. Thyroid symmetrical with no goiter or nodules palpated. No lymphadenopathy.   CV: RRR, no m/r/g, no peripheral edema. Pulses intact, +2 bilaterally. No cyanosis, pallor or clubbing. PULMONARY:  Unlabored, regular breathing. Clear bilaterally A&P w/o wheezes/rales/rhonchi. No accessory muscle use. No dullness to percussion. GI: BS present and normoactive. Soft, non-tender to palpation. No organomegaly or masses detected. No CVA tenderness. MSK: No erythema, warmth or tenderness. Cap refil <2 sec all extrem. No deformities or joint swelling noted.  Neuro: A/Ox3. No  focal deficits noted.   Skin: Warm, no lesions or rashe Psych: Normal affect and behavior. Judgement and thought content appropriate.     Lab Results:  CBC    Component Value Date/Time   WBC 5.7 03/23/2021 1330   RBC 4.46 03/23/2021 1330   HGB 14.0 03/23/2021 1330   HGB 12.9 10/15/2008 1013   HCT 42.2 03/23/2021 1330   HCT 37.8 10/15/2008 1013   PLT 452 (H) 03/23/2021 1330   PLT 328 10/15/2008 1013   MCV 94.6 03/23/2021 1330   MCV 94.2 10/15/2008 1013   MCH 31.4 03/23/2021 1330   MCHC 33.2 03/23/2021 1330   RDW 13.3 03/23/2021 1330   RDW 14.0 10/15/2008 1013   LYMPHSABS 1.1 11/23/2017 1257   LYMPHSABS 0.9 10/15/2008 1013   MONOABS 0.7 11/23/2017 1257   MONOABS 0.4 10/15/2008 1013   EOSABS 0.2 11/23/2017 1257   EOSABS 0.2 10/15/2008 1013   BASOSABS 0.0 11/23/2017 1257   BASOSABS 0.0 10/15/2008 1013    BMET    Component Value Date/Time   NA 134 (L) 03/26/2021 0149   K 4.5 03/26/2021 0149   CL 104 03/26/2021 0149   CO2 22 03/26/2021 0149   GLUCOSE 133 (H) 03/26/2021 0149   BUN 18 03/26/2021 0149   CREATININE 0.81 03/26/2021 0149   CALCIUM 8.9 03/26/2021 0149   GFRNONAA >60 03/26/2021 0149   GFRAA  04/28/2010 1119    >60        The eGFR has been calculated using the MDRD equation. This calculation has not been validated in all clinical situations. eGFR's persistently <60 mL/min signify possible Chronic Kidney Disease.    BNP No results found for: BNP   Imaging:  05/06/2021: CXR 2 view reviewed by me with clear lungs and no acute process.  DG Chest 2 View  Result Date: 05/06/2021 CLINICAL DATA:  Shortness of breath, productive cough, history GERD, hypertension EXAM: CHEST - 2 VIEW COMPARISON:  11/08/2018 FINDINGS: Normal heart size, mediastinal contours, and pulmonary vascularity. Lungs clear. No pleural effusion or pneumothorax. Bones unremarkable. IMPRESSION: Normal exam. Electronically Signed   By: Lavonia Dana M.D.   On: 05/06/2021 16:38      PFT  Results Latest Ref Rng & Units 11/29/2017  FVC-Pre L 3.34  FVC-Predicted Pre % 101  FVC-Post L 3.11  FVC-Predicted Post % 94  Pre FEV1/FVC % % 75  Post FEV1/FCV % % 79  FEV1-Pre L 2.50  FEV1-Predicted Pre % 97  FEV1-Post L 2.46  DLCO uncorrected ml/min/mmHg 25.74  DLCO UNC% % 109  DLCO corrected ml/min/mmHg 25.50  DLCO COR %Predicted % 108  DLVA Predicted % 119  TLC L 4.73  TLC % Predicted % 95  RV % Predicted % 89    No results found for: NITRICOXIDE      Assessment & Plan:   Asthmatic bronchitis with acute exacerbation Given symptoms and elevation in FeNO, will tx for asthmatic bronchitis flare with another prednisone taper and z pack. Suspect her cough is multifactorial related to upper airway irritation, given her hoarseness, and asthma flare. Continue Symbicort Twice daily and PRN albuterol. CXR clear today. Need to repeat CBC with diff and IgE once off prednisone to evaluate eosinophils and phenotype.   Patient Instructions  -Continue Symbicort 160 2 puffs Twice daily with spacer. Brush tongue and rinse mouth afterwards -Continue Albuterol inhaler 2 puffs or 3 mL neb every 6 hours as needed for shortness of breath or wheezing. Notify if symptoms persist despite rescue inhaler/neb use. -Continue Zyrtec 10 mg daily -Continue flonase nasal spray 2 puffs Twice daily  -Continue Singulair 10 mg At bedtime   -Prednisone taper. 4 tabs for 3 days, then 3 tabs for 3 days, 2 tabs for 3 days, then 1 tab for 3 days, then stop. Take in AM with food. -Z pack. Take 2 tabs on day one followed by 1 tab for four days for a total of 5 days. Notify immediately of any rash, itching, hives, or swelling, or seek emergency care.Finish your antibiotics in their entirety. Do not stop just because symptoms improve. Take with food to reduce GI upset.  Referral placed to ENT.  Follow up in one month with Dr. Valeta Harms or Alanson Aly. If symptoms do not improve or worsen, please contact office for  sooner follow up or seek emergency care.     Seasonal allergic rhinitis Stable symptoms; however, suspect she is having a slight increase in postnasal drip given erythematous throat and cough. May consider additional therapies if symptoms persist despite prednisone and z pack.   Hoarseness Started after thyroidectomy and being intubated. Minimal improvement since. Advised to avoid throat clearing as much as possible. Use throat lozenges. Referral to ENT for further evaluation.    Clayton Bibles, NP 05/06/2021  Pt aware and understands NP's role.

## 2021-05-06 NOTE — Patient Instructions (Addendum)
-  Continue Symbicort 160 2 puffs Twice daily with spacer. Brush tongue and rinse mouth afterwards -Continue Albuterol inhaler 2 puffs or 3 mL neb every 6 hours as needed for shortness of breath or wheezing. Notify if symptoms persist despite rescue inhaler/neb use. -Continue Zyrtec 10 mg daily -Continue flonase nasal spray 2 puffs Twice daily  -Continue Singulair 10 mg At bedtime   -Prednisone taper. 4 tabs for 3 days, then 3 tabs for 3 days, 2 tabs for 3 days, then 1 tab for 3 days, then stop. Take in AM with food. -Z pack. Take 2 tabs on day one followed by 1 tab for four days for a total of 5 days. Notify immediately of any rash, itching, hives, or swelling, or seek emergency care.Finish your antibiotics in their entirety. Do not stop just because symptoms improve. Take with food to reduce GI upset.  Referral placed to ENT.  Follow up in one month with Dr. Valeta Harms or Alanson Aly. If symptoms do not improve or worsen, please contact office for sooner follow up or seek emergency care.

## 2021-05-06 NOTE — Assessment & Plan Note (Signed)
Stable symptoms; however, suspect she is having a slight increase in postnasal drip given erythematous throat and cough. May consider additional therapies if symptoms persist despite prednisone and z pack.

## 2021-05-16 ENCOUNTER — Telehealth: Payer: Self-pay | Admitting: Nurse Practitioner

## 2021-05-16 NOTE — Telephone Encounter (Signed)
Called patient's husband but he did not answer. Left message for him to call back.  ?

## 2021-05-18 DIAGNOSIS — C73 Malignant neoplasm of thyroid gland: Secondary | ICD-10-CM | POA: Diagnosis not present

## 2021-05-18 DIAGNOSIS — E041 Nontoxic single thyroid nodule: Secondary | ICD-10-CM | POA: Diagnosis not present

## 2021-05-25 DIAGNOSIS — M9901 Segmental and somatic dysfunction of cervical region: Secondary | ICD-10-CM | POA: Diagnosis not present

## 2021-05-25 DIAGNOSIS — M9903 Segmental and somatic dysfunction of lumbar region: Secondary | ICD-10-CM | POA: Diagnosis not present

## 2021-05-25 DIAGNOSIS — M5134 Other intervertebral disc degeneration, thoracic region: Secondary | ICD-10-CM | POA: Diagnosis not present

## 2021-05-25 DIAGNOSIS — M4184 Other forms of scoliosis, thoracic region: Secondary | ICD-10-CM | POA: Diagnosis not present

## 2021-05-27 NOTE — Telephone Encounter (Signed)
Attempted to call pt's spouse Shanon Brow but unable to reach. Left message for him to return call. Due to multiple attempts trying to reach him and unable to do so, per protocol, encounter will be closed.

## 2021-05-31 DIAGNOSIS — E041 Nontoxic single thyroid nodule: Secondary | ICD-10-CM | POA: Diagnosis not present

## 2021-05-31 DIAGNOSIS — R898 Other abnormal findings in specimens from other organs, systems and tissues: Secondary | ICD-10-CM | POA: Diagnosis not present

## 2021-05-31 DIAGNOSIS — E890 Postprocedural hypothyroidism: Secondary | ICD-10-CM | POA: Diagnosis not present

## 2021-05-31 DIAGNOSIS — C73 Malignant neoplasm of thyroid gland: Secondary | ICD-10-CM | POA: Diagnosis not present

## 2021-06-01 DIAGNOSIS — J3800 Paralysis of vocal cords and larynx, unspecified: Secondary | ICD-10-CM | POA: Diagnosis not present

## 2021-06-02 DIAGNOSIS — M5134 Other intervertebral disc degeneration, thoracic region: Secondary | ICD-10-CM | POA: Diagnosis not present

## 2021-06-02 DIAGNOSIS — M4184 Other forms of scoliosis, thoracic region: Secondary | ICD-10-CM | POA: Diagnosis not present

## 2021-06-02 DIAGNOSIS — M9903 Segmental and somatic dysfunction of lumbar region: Secondary | ICD-10-CM | POA: Diagnosis not present

## 2021-06-02 DIAGNOSIS — M9901 Segmental and somatic dysfunction of cervical region: Secondary | ICD-10-CM | POA: Diagnosis not present

## 2021-06-07 ENCOUNTER — Other Ambulatory Visit: Payer: Self-pay | Admitting: Internal Medicine

## 2021-06-07 DIAGNOSIS — C73 Malignant neoplasm of thyroid gland: Secondary | ICD-10-CM

## 2021-06-08 DIAGNOSIS — M9903 Segmental and somatic dysfunction of lumbar region: Secondary | ICD-10-CM | POA: Diagnosis not present

## 2021-06-08 DIAGNOSIS — M4184 Other forms of scoliosis, thoracic region: Secondary | ICD-10-CM | POA: Diagnosis not present

## 2021-06-08 DIAGNOSIS — M9901 Segmental and somatic dysfunction of cervical region: Secondary | ICD-10-CM | POA: Diagnosis not present

## 2021-06-08 DIAGNOSIS — M5134 Other intervertebral disc degeneration, thoracic region: Secondary | ICD-10-CM | POA: Diagnosis not present

## 2021-06-09 ENCOUNTER — Ambulatory Visit
Admission: RE | Admit: 2021-06-09 | Discharge: 2021-06-09 | Disposition: A | Discharge status: Home / Self Care | Payer: BC Managed Care – PPO | Source: Ambulatory Visit | Attending: Internal Medicine | Admitting: Internal Medicine

## 2021-06-09 ENCOUNTER — Ambulatory Visit: Payer: Self-pay | Admitting: Nurse Practitioner

## 2021-06-09 DIAGNOSIS — E890 Postprocedural hypothyroidism: Secondary | ICD-10-CM | POA: Diagnosis not present

## 2021-06-09 DIAGNOSIS — C73 Malignant neoplasm of thyroid gland: Secondary | ICD-10-CM

## 2021-06-10 DIAGNOSIS — E890 Postprocedural hypothyroidism: Secondary | ICD-10-CM | POA: Diagnosis not present

## 2021-06-10 DIAGNOSIS — D440 Neoplasm of uncertain behavior of thyroid gland: Secondary | ICD-10-CM | POA: Diagnosis not present

## 2021-06-10 DIAGNOSIS — C73 Malignant neoplasm of thyroid gland: Secondary | ICD-10-CM | POA: Diagnosis not present

## 2021-06-14 DIAGNOSIS — M5134 Other intervertebral disc degeneration, thoracic region: Secondary | ICD-10-CM | POA: Diagnosis not present

## 2021-06-14 DIAGNOSIS — M4184 Other forms of scoliosis, thoracic region: Secondary | ICD-10-CM | POA: Diagnosis not present

## 2021-06-14 DIAGNOSIS — M9901 Segmental and somatic dysfunction of cervical region: Secondary | ICD-10-CM | POA: Diagnosis not present

## 2021-06-14 DIAGNOSIS — M9903 Segmental and somatic dysfunction of lumbar region: Secondary | ICD-10-CM | POA: Diagnosis not present

## 2021-06-15 DIAGNOSIS — C73 Malignant neoplasm of thyroid gland: Secondary | ICD-10-CM | POA: Diagnosis not present

## 2021-06-22 DIAGNOSIS — C73 Malignant neoplasm of thyroid gland: Secondary | ICD-10-CM | POA: Diagnosis not present

## 2021-06-22 DIAGNOSIS — E041 Nontoxic single thyroid nodule: Secondary | ICD-10-CM | POA: Diagnosis not present

## 2021-06-22 DIAGNOSIS — R898 Other abnormal findings in specimens from other organs, systems and tissues: Secondary | ICD-10-CM | POA: Diagnosis not present

## 2021-06-22 DIAGNOSIS — E890 Postprocedural hypothyroidism: Secondary | ICD-10-CM | POA: Diagnosis not present

## 2021-06-27 DIAGNOSIS — M25551 Pain in right hip: Secondary | ICD-10-CM | POA: Diagnosis not present

## 2021-06-27 DIAGNOSIS — M545 Low back pain, unspecified: Secondary | ICD-10-CM | POA: Diagnosis not present

## 2021-06-30 DIAGNOSIS — M9903 Segmental and somatic dysfunction of lumbar region: Secondary | ICD-10-CM | POA: Diagnosis not present

## 2021-06-30 DIAGNOSIS — M9901 Segmental and somatic dysfunction of cervical region: Secondary | ICD-10-CM | POA: Diagnosis not present

## 2021-06-30 DIAGNOSIS — M4184 Other forms of scoliosis, thoracic region: Secondary | ICD-10-CM | POA: Diagnosis not present

## 2021-06-30 DIAGNOSIS — M5134 Other intervertebral disc degeneration, thoracic region: Secondary | ICD-10-CM | POA: Diagnosis not present

## 2021-07-06 DIAGNOSIS — M25551 Pain in right hip: Secondary | ICD-10-CM | POA: Diagnosis not present

## 2021-07-07 DIAGNOSIS — M545 Low back pain, unspecified: Secondary | ICD-10-CM | POA: Diagnosis not present

## 2021-07-12 DIAGNOSIS — E890 Postprocedural hypothyroidism: Secondary | ICD-10-CM | POA: Diagnosis not present

## 2021-07-12 DIAGNOSIS — C73 Malignant neoplasm of thyroid gland: Secondary | ICD-10-CM | POA: Diagnosis not present

## 2021-07-14 DIAGNOSIS — M25559 Pain in unspecified hip: Secondary | ICD-10-CM | POA: Diagnosis not present

## 2021-07-18 DIAGNOSIS — M25551 Pain in right hip: Secondary | ICD-10-CM | POA: Diagnosis not present

## 2021-08-08 DIAGNOSIS — I10 Essential (primary) hypertension: Secondary | ICD-10-CM | POA: Diagnosis not present

## 2021-08-08 DIAGNOSIS — E041 Nontoxic single thyroid nodule: Secondary | ICD-10-CM | POA: Diagnosis not present

## 2021-08-08 DIAGNOSIS — R898 Other abnormal findings in specimens from other organs, systems and tissues: Secondary | ICD-10-CM | POA: Diagnosis not present

## 2021-08-08 DIAGNOSIS — R825 Elevated urine levels of drugs, medicaments and biological substances: Secondary | ICD-10-CM | POA: Diagnosis not present

## 2021-08-08 DIAGNOSIS — C73 Malignant neoplasm of thyroid gland: Secondary | ICD-10-CM | POA: Diagnosis not present

## 2021-08-08 DIAGNOSIS — E890 Postprocedural hypothyroidism: Secondary | ICD-10-CM | POA: Diagnosis not present

## 2021-08-12 DIAGNOSIS — D123 Benign neoplasm of transverse colon: Secondary | ICD-10-CM | POA: Diagnosis not present

## 2021-08-12 DIAGNOSIS — Z1211 Encounter for screening for malignant neoplasm of colon: Secondary | ICD-10-CM | POA: Diagnosis not present

## 2021-08-12 DIAGNOSIS — K5730 Diverticulosis of large intestine without perforation or abscess without bleeding: Secondary | ICD-10-CM | POA: Diagnosis not present

## 2021-08-12 DIAGNOSIS — D122 Benign neoplasm of ascending colon: Secondary | ICD-10-CM | POA: Diagnosis not present

## 2021-08-16 DIAGNOSIS — R490 Dysphonia: Secondary | ICD-10-CM | POA: Diagnosis not present

## 2021-08-16 DIAGNOSIS — J3801 Paralysis of vocal cords and larynx, unilateral: Secondary | ICD-10-CM | POA: Diagnosis not present

## 2021-08-16 DIAGNOSIS — J3800 Paralysis of vocal cords and larynx, unspecified: Secondary | ICD-10-CM | POA: Diagnosis not present

## 2021-08-23 DIAGNOSIS — C73 Malignant neoplasm of thyroid gland: Secondary | ICD-10-CM | POA: Diagnosis not present

## 2021-08-23 DIAGNOSIS — R825 Elevated urine levels of drugs, medicaments and biological substances: Secondary | ICD-10-CM | POA: Diagnosis not present

## 2021-08-23 DIAGNOSIS — E890 Postprocedural hypothyroidism: Secondary | ICD-10-CM | POA: Diagnosis not present

## 2021-08-23 DIAGNOSIS — R898 Other abnormal findings in specimens from other organs, systems and tissues: Secondary | ICD-10-CM | POA: Diagnosis not present

## 2021-08-31 DIAGNOSIS — M8580 Other specified disorders of bone density and structure, unspecified site: Secondary | ICD-10-CM | POA: Diagnosis not present

## 2021-09-01 DIAGNOSIS — H9201 Otalgia, right ear: Secondary | ICD-10-CM | POA: Diagnosis not present

## 2021-09-13 DIAGNOSIS — M4184 Other forms of scoliosis, thoracic region: Secondary | ICD-10-CM | POA: Diagnosis not present

## 2021-09-13 DIAGNOSIS — M9901 Segmental and somatic dysfunction of cervical region: Secondary | ICD-10-CM | POA: Diagnosis not present

## 2021-09-13 DIAGNOSIS — M5134 Other intervertebral disc degeneration, thoracic region: Secondary | ICD-10-CM | POA: Diagnosis not present

## 2021-09-13 DIAGNOSIS — M9903 Segmental and somatic dysfunction of lumbar region: Secondary | ICD-10-CM | POA: Diagnosis not present

## 2021-09-14 DIAGNOSIS — H1045 Other chronic allergic conjunctivitis: Secondary | ICD-10-CM | POA: Diagnosis not present

## 2021-10-03 ENCOUNTER — Other Ambulatory Visit: Payer: Self-pay | Admitting: Obstetrics and Gynecology

## 2021-10-03 DIAGNOSIS — Z1231 Encounter for screening mammogram for malignant neoplasm of breast: Secondary | ICD-10-CM

## 2021-10-06 DIAGNOSIS — M9901 Segmental and somatic dysfunction of cervical region: Secondary | ICD-10-CM | POA: Diagnosis not present

## 2021-10-06 DIAGNOSIS — M5134 Other intervertebral disc degeneration, thoracic region: Secondary | ICD-10-CM | POA: Diagnosis not present

## 2021-10-06 DIAGNOSIS — M4184 Other forms of scoliosis, thoracic region: Secondary | ICD-10-CM | POA: Diagnosis not present

## 2021-10-06 DIAGNOSIS — M9903 Segmental and somatic dysfunction of lumbar region: Secondary | ICD-10-CM | POA: Diagnosis not present

## 2021-10-12 DIAGNOSIS — M79675 Pain in left toe(s): Secondary | ICD-10-CM | POA: Diagnosis not present

## 2021-10-24 ENCOUNTER — Ambulatory Visit: Payer: BC Managed Care – PPO | Admitting: Podiatry

## 2021-10-28 DIAGNOSIS — L089 Local infection of the skin and subcutaneous tissue, unspecified: Secondary | ICD-10-CM | POA: Diagnosis not present

## 2021-11-05 DIAGNOSIS — S82842A Displaced bimalleolar fracture of left lower leg, initial encounter for closed fracture: Secondary | ICD-10-CM | POA: Diagnosis not present

## 2021-11-05 DIAGNOSIS — M79662 Pain in left lower leg: Secondary | ICD-10-CM | POA: Diagnosis not present

## 2021-11-05 DIAGNOSIS — S99912A Unspecified injury of left ankle, initial encounter: Secondary | ICD-10-CM | POA: Diagnosis not present

## 2021-11-05 DIAGNOSIS — S82832A Other fracture of upper and lower end of left fibula, initial encounter for closed fracture: Secondary | ICD-10-CM | POA: Diagnosis not present

## 2021-11-05 DIAGNOSIS — S8992XA Unspecified injury of left lower leg, initial encounter: Secondary | ICD-10-CM | POA: Diagnosis not present

## 2021-11-05 DIAGNOSIS — M79674 Pain in right toe(s): Secondary | ICD-10-CM | POA: Diagnosis not present

## 2021-11-05 DIAGNOSIS — M25572 Pain in left ankle and joints of left foot: Secondary | ICD-10-CM | POA: Diagnosis not present

## 2021-11-07 ENCOUNTER — Other Ambulatory Visit: Payer: Self-pay | Admitting: Physician Assistant

## 2021-11-07 ENCOUNTER — Ambulatory Visit
Admission: RE | Admit: 2021-11-07 | Discharge: 2021-11-07 | Disposition: A | Discharge status: Home / Self Care | Payer: BC Managed Care – PPO | Source: Ambulatory Visit | Attending: Physician Assistant | Admitting: Physician Assistant

## 2021-11-07 ENCOUNTER — Ambulatory Visit: Payer: BC Managed Care – PPO

## 2021-11-07 DIAGNOSIS — M25572 Pain in left ankle and joints of left foot: Secondary | ICD-10-CM

## 2021-11-07 DIAGNOSIS — S82832A Other fracture of upper and lower end of left fibula, initial encounter for closed fracture: Secondary | ICD-10-CM | POA: Diagnosis not present

## 2021-11-07 DIAGNOSIS — S8252XA Displaced fracture of medial malleolus of left tibia, initial encounter for closed fracture: Secondary | ICD-10-CM | POA: Diagnosis not present

## 2021-11-07 DIAGNOSIS — S82432A Displaced oblique fracture of shaft of left fibula, initial encounter for closed fracture: Secondary | ICD-10-CM | POA: Diagnosis not present

## 2021-11-07 DIAGNOSIS — S8262XA Displaced fracture of lateral malleolus of left fibula, initial encounter for closed fracture: Secondary | ICD-10-CM | POA: Diagnosis not present

## 2021-11-08 DIAGNOSIS — S82842A Displaced bimalleolar fracture of left lower leg, initial encounter for closed fracture: Secondary | ICD-10-CM | POA: Diagnosis not present

## 2021-11-09 ENCOUNTER — Encounter (HOSPITAL_BASED_OUTPATIENT_CLINIC_OR_DEPARTMENT_OTHER): Payer: Self-pay | Admitting: Orthopaedic Surgery

## 2021-11-09 ENCOUNTER — Other Ambulatory Visit: Payer: Self-pay

## 2021-11-09 DIAGNOSIS — R748 Abnormal levels of other serum enzymes: Secondary | ICD-10-CM | POA: Diagnosis not present

## 2021-11-09 DIAGNOSIS — C73 Malignant neoplasm of thyroid gland: Secondary | ICD-10-CM | POA: Diagnosis not present

## 2021-11-11 DIAGNOSIS — E890 Postprocedural hypothyroidism: Secondary | ICD-10-CM | POA: Diagnosis not present

## 2021-11-11 DIAGNOSIS — C73 Malignant neoplasm of thyroid gland: Secondary | ICD-10-CM | POA: Diagnosis not present

## 2021-11-15 NOTE — Discharge Instructions (Signed)
Jennifer Ramanathan, MD [facility]  Please read the following information regarding your care after surgery.  Medications  You only need a prescription for the narcotic pain medicine (ex. oxycodone, Percocet, Norco).  All of the other medicines listed below are available over the counter. ? Aleve 2 pills twice a day for the first 3 days after surgery. ? acetominophen (Tylenol) 650 mg every 4-6 hours as you need for minor to moderate pain ? oxycodone as prescribed for severe pain  ? To help prevent blood clots, take aspirin (81 mg) twice daily for 42 days after surgery (or total duration of nonweightbearing).  You should also get up every hour while you are awake to move around.  Weight Bearing ? Do NOT bear any weight on the operated leg or foot. This means do NOT touch your surgical leg to the ground!  Cast / Splint / Dressing ? If you have a splint, do NOT remove this. Keep your splint, cast or dressing clean and dry.  Don't put anything (coat hanger, pencil, etc) down inside of it.  If it gets wet, call the office immediately to schedule an appointment for a cast change.  Swelling IMPORTANT: It is normal for you to have swelling where you had surgery. To reduce swelling and pain, keep at least 3 pillows under your leg so that your toes are above your nose and your heel is above the level of your hip.  It may be necessary to keep your foot or leg elevated for several weeks.  This is critical to helping your incisions heal and your pain to feel better.  Follow Up Call my office at [phone] when you are discharged from the hospital or surgery center to schedule an appointment to be seen 7-10 days after surgery.  Call my office at [phone] if you develop a fever >101.5 F, nausea, vomiting, bleeding from the surgical site or severe pain.       with you for 24 hours following the procedure.  For the next 24 hours, DO NOT: -Drive a car -Operate machinery -Drink alcoholic beverages -Take any medication unless instructed by your physician -Make any legal decisions or sign important papers.  Meals: Start with liquid foods such as gelatin or soup. Progress to regular foods as tolerated. Avoid greasy, spicy, heavy foods. If nausea and/or vomiting occur, drink only clear liquids until the nausea and/or  vomiting subsides. Call your physician if vomiting continues.  Special Instructions/Symptoms: Your throat may feel dry or sore from the anesthesia or the breathing tube placed in your throat during surgery. If this causes discomfort, gargle with warm salt water. The discomfort should disappear within 24 hours.  If you had a scopolamine patch placed behind your ear for the management of post- operative nausea and/or vomiting:  1. The medication in the patch is effective for 72 hours, after which it should be removed.  Wrap patch in a tissue and discard in the trash. Wash hands thoroughly with soap and water. 2. You may remove the patch earlier than 72 hours if you experience unpleasant side effects which may include dry mouth, dizziness or visual disturbances. 3. Avoid touching the patch. Wash your hands with soap and water after contact with the patch.    Post Anesthesia Home Care Instructions  Activity: Get plenty of rest for the remainder of the day. A responsible individual must stay with you for 24 hours following the procedure.  For the next 24 hours, DO NOT: -Drive a car -Operate machinery -Drink alcoholic beverages -Take any medication unless instructed by your physician -Make any legal decisions or sign important papers.  Meals: Start with liquid foods such as gelatin or soup. Progress to regular foods as tolerated. Avoid greasy, spicy, heavy foods. If nausea and/or vomiting occur, drink only clear liquids until the nausea and/or vomiting subsides. Call your physician if vomiting continues.  Special Instructions/Symptoms: Your throat may feel dry or sore from the anesthesia or the breathing tube placed in your throat during surgery. If this causes discomfort, gargle with warm salt water. The discomfort should disappear within 24 hours.  If you had a scopolamine patch placed behind your ear for the management of post- operative nausea and/or vomiting:  1. The medication in the  patch is effective for 72 hours, after which it should be removed.  Wrap patch in a tissue and discard in the trash. Wash hands thoroughly with soap and water. 2. You may remove the patch earlier than 72 hours if you experience unpleasant side effects which may include dry mouth, dizziness or visual disturbances. 3. Avoid touching the patch. Wash your hands with soap and water after contact with the patch.     Regional Anesthesia Blocks  1. Numbness or the inability to move the "blocked" extremity may last from 3-48 hours after placement. The length of time depends on the medication injected and your individual response to the medication. If the numbness is not going away after 48 hours, call your surgeon.  2. The extremity that is blocked will need to be protected until the numbness is gone and the  Strength has returned. Because you cannot feel it, you will need to take extra care to avoid injury. Because it may be weak, you may have difficulty moving it or using it. You may not know what position it is in without looking at it while the block is in effect.  3. For blocks in the legs and

## 2021-11-15 NOTE — H&P (Signed)
ORTHOPAEDIC SURGERY H&P  Subjective:  The patient presents with left ankle fracture.   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   Sinus headache    Thyroid disease    hx of thyroid nodule (stable)    Past Surgical History:  Procedure Laterality Date   APPENDECTOMY  2007   benign thyroid nodule removed  1999   BREAST EXCISIONAL BIOPSY Right    COLONOSCOPY  12/2009   LYMPH NODE DISSECTION N/A 03/25/2021   Procedure: LIMITED LYMPH NODE DISSECTION;  Surgeon: Armandina Gemma, MD;  Location: [facility];  Service: General;  Laterality: N/A;   THYROIDECTOMY N/A 03/25/2021   Procedure: TOTAL THYROIDECTOMY;  Surgeon: Armandina Gemma, MD;  Location: [facility];  Service: General;  Laterality: N/A;     (Not in an outpatient encounter)    Allergies  Allergen Reactions   Ketamine Other (See Comments)    Hallucinations, heart stress     Social History   Socioeconomic History   Marital status: Married    Spouse name: Not on file   Number of children: Not on file   Years of education: Not on file   Highest education level: Not on file  Occupational History   Not on file  Tobacco Use   Smoking status: Never   Smokeless tobacco: Never  Vaping Use   Vaping Use: Never used  Substance and Sexual Activity   Alcohol use: Yes    Comment: occasional   Drug use: No   Sexual activity: Not Currently    Birth control/protection: Post-menopausal  Other Topics Concern   Not on file  Social History Narrative   Not on file   Social Determinants of Health   Financial Resource Strain: Not on file  Food Insecurity: Not on file  Transportation Needs: Not on file  Physical Activity: Not on file   Stress: Not on file  Social Connections: Not on file  Intimate Partner Violence: Not on file     History reviewed. No pertinent family history.   Review of Systems Pertinent items are noted in HPI.  Objective: Vital signs in last 24 hours:    11/09/2021    1:07 PM 05/06/2021    4:14 PM 03/26/2021    8:06 AM  Vitals with BMI  Height '5\' 3"'$  '5\' 3"'$    Weight 145 lbs 139 lbs 10 oz   BMI 47.65 46.50   Systolic  354 656  Diastolic  78 67  Pulse  80 86      EXAM: General: Well nourished, well developed. Awake, alert and oriented to time, place, person. Normal mood and affect. No apparent distress. Breathing room air.  Operative Lower Extremity: Tenderness to palpation - left ankle SILT, motor intact throughout Palpable DP and PT pulses Special testing: None  The contralateral foot/ankle was examined for comparison and noted to be neurovascularly intact with no localized deformity, swelling, or tenderness.  Imaging Review Images taken were independently reviewed by me demonstrating left ankle fracture.  Assessment/Plan: The clinical and radiographic findings were reviewed and discussed at length with the patient.  The patient has left ankle fracture.  We spoke at length about the natural course of these findings. We discussed nonoperative and operative treatment options in detail.  I have recommended the following: Surgery in the form of left ankle ORIF. The risks and benefits were presented and reviewed. The risks due to infection, stiffness, nerve/vessel/tendon injury, wound healing issues, failure of this surgery, failure/irritation secondary to any implanted suture and/or hardware, need for further surgery, thromboembolic events, amputation, death among others were discussed. The patient acknowledged the explanation & agreed to proceed with the plan.  Armond Hang  Orthopaedic Surgery [facility]

## 2021-11-15 NOTE — Anesthesia Preprocedure Evaluation (Signed)
Anesthesia Evaluation  Patient identified by MRN, date of birth, ID band Patient awake    Reviewed: Allergy & Precautions, NPO status , Patient's Chart, lab work & pertinent test results  Airway Mallampati: I  TM Distance: >3 FB Neck ROM: Full    Dental no notable dental hx. (+) Teeth Intact, Dental Advisory Given   Pulmonary neg pulmonary ROS, asthma ,    Pulmonary exam normal breath sounds clear to auscultation       Cardiovascular hypertension, Pt. on medications (-) angina(-) Past MI Normal cardiovascular exam Rhythm:Regular Rate:Normal     Neuro/Psych  Headaches, PSYCHIATRIC DISORDERS Anxiety    GI/Hepatic Neg liver ROS, GERD  Medicated,  Endo/Other  negative endocrine ROSTHYROID NEOPLASM OF UNCERTAIN BEHAVIOR  Renal/GU   negative genitourinary   Musculoskeletal negative musculoskeletal ROS (+) Arthritis ,   Abdominal   Peds negative pediatric ROS (+)  Hematology negative hematology ROS (+)   Anesthesia Other Findings Day of surgery medications reviewed with the patient.  Reproductive/Obstetrics negative OB ROS                           Anesthesia Physical  Anesthesia Plan  ASA: 2  Anesthesia Plan: General and Regional   Post-op Pain Management: Tylenol PO (pre-op) and Regional block*   Induction: Intravenous  PONV Risk Score and Plan: 4 or greater and Midazolam, Dexamethasone, Ondansetron and TIVA  Airway Management Planned: LMA  Additional Equipment: None  Intra-op Plan:   Post-operative Plan: Extubation in OR  Informed Consent: I have reviewed the patients History and Physical, chart, labs and discussed the procedure including the risks, benefits and alternatives for the proposed anesthesia with the patient or authorized representative who has indicated his/her understanding and acceptance.     Dental advisory given  Plan Discussed with: CRNA and  Anesthesiologist  Anesthesia Plan Comments:         Anesthesia Quick Evaluation

## 2021-11-16 ENCOUNTER — Ambulatory Visit (HOSPITAL_BASED_OUTPATIENT_CLINIC_OR_DEPARTMENT_OTHER): Payer: BC Managed Care – PPO | Admitting: Anesthesiology

## 2021-11-16 ENCOUNTER — Encounter (HOSPITAL_BASED_OUTPATIENT_CLINIC_OR_DEPARTMENT_OTHER)
Admission: RE | Disposition: A | Discharge status: Home / Self Care | Payer: Self-pay | Source: Home / Self Care | Attending: Orthopaedic Surgery

## 2021-11-16 ENCOUNTER — Ambulatory Visit (HOSPITAL_BASED_OUTPATIENT_CLINIC_OR_DEPARTMENT_OTHER)
Admission: RE | Admit: 2021-11-16 | Discharge: 2021-11-16 | Disposition: A | Discharge status: Home / Self Care | Payer: BC Managed Care – PPO | Attending: Orthopaedic Surgery | Admitting: Orthopaedic Surgery

## 2021-11-16 ENCOUNTER — Ambulatory Visit (HOSPITAL_COMMUNITY): Payer: BC Managed Care – PPO

## 2021-11-16 ENCOUNTER — Encounter (HOSPITAL_BASED_OUTPATIENT_CLINIC_OR_DEPARTMENT_OTHER): Payer: Self-pay | Admitting: Orthopaedic Surgery

## 2021-11-16 ENCOUNTER — Other Ambulatory Visit: Payer: Self-pay

## 2021-11-16 DIAGNOSIS — G8918 Other acute postprocedural pain: Secondary | ICD-10-CM | POA: Diagnosis not present

## 2021-11-16 DIAGNOSIS — Z4789 Encounter for other orthopedic aftercare: Secondary | ICD-10-CM | POA: Diagnosis not present

## 2021-11-16 DIAGNOSIS — S82842A Displaced bimalleolar fracture of left lower leg, initial encounter for closed fracture: Secondary | ICD-10-CM | POA: Insufficient documentation

## 2021-11-16 DIAGNOSIS — S82242A Displaced spiral fracture of shaft of left tibia, initial encounter for closed fracture: Secondary | ICD-10-CM | POA: Diagnosis not present

## 2021-11-16 DIAGNOSIS — X58XXXA Exposure to other specified factors, initial encounter: Secondary | ICD-10-CM | POA: Diagnosis not present

## 2021-11-16 DIAGNOSIS — Z01818 Encounter for other preprocedural examination: Secondary | ICD-10-CM

## 2021-11-16 HISTORY — PX: ORIF ANKLE FRACTURE: SHX5408

## 2021-11-16 HISTORY — DX: Unspecified asthma, uncomplicated: J45.909

## 2021-11-16 SURGERY — OPEN REDUCTION INTERNAL FIXATION (ORIF) ANKLE FRACTURE
Anesthesia: Regional | Site: Ankle | Laterality: Left

## 2021-11-16 MED ORDER — PROPOFOL 500 MG/50ML IV EMUL
INTRAVENOUS | Status: DC | PRN
  Administered 2021-11-16: 200 ug/kg/min via INTRAVENOUS

## 2021-11-16 MED ORDER — OXYCODONE HCL 5 MG PO TABS
ORAL_TABLET | ORAL | Status: AC
  Filled 2021-11-16: qty 1

## 2021-11-16 MED ORDER — FENTANYL CITRATE (PF) 100 MCG/2ML IJ SOLN
INTRAMUSCULAR | Status: DC | PRN
  Administered 2021-11-16 (×2): 25 ug via INTRAVENOUS

## 2021-11-16 MED ORDER — VANCOMYCIN HCL 500 MG IV SOLR
INTRAVENOUS | Status: DC | PRN
  Administered 2021-11-16: 500 mg via TOPICAL

## 2021-11-16 MED ORDER — CEFAZOLIN SODIUM-DEXTROSE 2-4 GM/100ML-% IV SOLN
INTRAVENOUS | Status: AC
  Filled 2021-11-16: qty 100

## 2021-11-16 MED ORDER — FENTANYL CITRATE (PF) 100 MCG/2ML IJ SOLN
INTRAMUSCULAR | Status: AC
  Filled 2021-11-16: qty 2

## 2021-11-16 MED ORDER — VANCOMYCIN HCL 500 MG IV SOLR
INTRAVENOUS | Status: AC
  Filled 2021-11-16: qty 10

## 2021-11-16 MED ORDER — PROPOFOL 10 MG/ML IV BOLUS
INTRAVENOUS | Status: DC | PRN
  Administered 2021-11-16: 150 mg via INTRAVENOUS

## 2021-11-16 MED ORDER — BUPIVACAINE-EPINEPHRINE (PF) 0.5% -1:200000 IJ SOLN
INTRAMUSCULAR | Status: AC
  Filled 2021-11-16: qty 30

## 2021-11-16 MED ORDER — 0.9 % SODIUM CHLORIDE (POUR BTL) OPTIME
TOPICAL | Status: DC | PRN
  Administered 2021-11-16: 300 mL

## 2021-11-16 MED ORDER — FENTANYL CITRATE (PF) 100 MCG/2ML IJ SOLN
100.0000 ug | Freq: Once | INTRAMUSCULAR | Status: AC
  Administered 2021-11-16: 100 ug via INTRAVENOUS

## 2021-11-16 MED ORDER — LACTATED RINGERS IV SOLN: INTRAVENOUS | Status: DC

## 2021-11-16 MED ORDER — BUPIVACAINE LIPOSOME 1.3 % IJ SUSP
INTRAMUSCULAR | Status: DC | PRN
  Administered 2021-11-16: 10 mL via PERINEURAL

## 2021-11-16 MED ORDER — MIDAZOLAM HCL 2 MG/2ML IJ SOLN
INTRAMUSCULAR | Status: AC
  Filled 2021-11-16: qty 2

## 2021-11-16 MED ORDER — CEFAZOLIN SODIUM-DEXTROSE 2-4 GM/100ML-% IV SOLN
2.0000 g | INTRAVENOUS | Status: AC
Start: 2021-11-16 — End: 2021-11-16
  Administered 2021-11-16: 2 g via INTRAVENOUS

## 2021-11-16 MED ORDER — ONDANSETRON HCL 4 MG/2ML IJ SOLN: 4.0000 mg | Freq: Once | INTRAMUSCULAR | Status: DC | PRN

## 2021-11-16 MED ORDER — MEPERIDINE HCL 25 MG/ML IJ SOLN: 6.2500 mg | INTRAMUSCULAR | Status: DC | PRN

## 2021-11-16 MED ORDER — OXYCODONE HCL 5 MG/5ML PO SOLN: 5.0000 mg | Freq: Once | ORAL | Status: AC | PRN

## 2021-11-16 MED ORDER — ONDANSETRON HCL 4 MG/2ML IJ SOLN
INTRAMUSCULAR | Status: DC | PRN
  Administered 2021-11-16: 4 mg via INTRAVENOUS

## 2021-11-16 MED ORDER — PROPOFOL 500 MG/50ML IV EMUL
INTRAVENOUS | Status: AC
  Filled 2021-11-16: qty 50

## 2021-11-16 MED ORDER — DEXMEDETOMIDINE (PRECEDEX) IN NS 20 MCG/5ML (4 MCG/ML) IV SYRINGE
PREFILLED_SYRINGE | INTRAVENOUS | Status: DC | PRN
  Administered 2021-11-16: 8 ug via INTRAVENOUS
  Administered 2021-11-16: 4 ug via INTRAVENOUS
  Administered 2021-11-16: 8 ug via INTRAVENOUS

## 2021-11-16 MED ORDER — DEXAMETHASONE SODIUM PHOSPHATE 10 MG/ML IJ SOLN
INTRAMUSCULAR | Status: DC | PRN
  Administered 2021-11-16: 5 mg via INTRAVENOUS

## 2021-11-16 MED ORDER — LIDOCAINE-EPINEPHRINE 1 %-1:100000 IJ SOLN
INTRAMUSCULAR | Status: AC
Start: 2021-11-16 — End: ?
  Filled 2021-11-16: qty 1

## 2021-11-16 MED ORDER — BUPIVACAINE HCL (PF) 0.5 % IJ SOLN
INTRAMUSCULAR | Status: DC | PRN
  Administered 2021-11-16: 10 mL via PERINEURAL

## 2021-11-16 MED ORDER — ROPIVACAINE HCL 5 MG/ML IJ SOLN
INTRAMUSCULAR | Status: DC | PRN
  Administered 2021-11-16: 20 mL via PERINEURAL

## 2021-11-16 MED ORDER — MIDAZOLAM HCL 2 MG/2ML IJ SOLN
2.0000 mg | Freq: Once | INTRAMUSCULAR | Status: AC
  Administered 2021-11-16: 2 mg via INTRAVENOUS

## 2021-11-16 MED ORDER — ACETAMINOPHEN 325 MG PO TABS: 325.0000 mg | ORAL_TABLET | ORAL | Status: DC | PRN

## 2021-11-16 MED ORDER — OXYCODONE HCL 5 MG PO TABS
5.0000 mg | ORAL_TABLET | Freq: Once | ORAL | Status: AC | PRN
  Administered 2021-11-16: 5 mg via ORAL

## 2021-11-16 MED ORDER — ACETAMINOPHEN 160 MG/5ML PO SOLN: 325.0000 mg | ORAL | Status: DC | PRN

## 2021-11-16 MED ORDER — LIDOCAINE 2% (20 MG/ML) 5 ML SYRINGE
INTRAMUSCULAR | Status: DC | PRN
  Administered 2021-11-16: 40 mg via INTRAVENOUS

## 2021-11-16 MED ORDER — FENTANYL CITRATE (PF) 100 MCG/2ML IJ SOLN
25.0000 ug | INTRAMUSCULAR | Status: DC | PRN
  Administered 2021-11-16 (×3): 50 ug via INTRAVENOUS

## 2021-11-16 SURGICAL SUPPLY — 87 items
APL PRP STRL LF DISP 70% ISPRP (MISCELLANEOUS) ×1
BANDAGE ESMARK 6X9 LF (GAUZE/BANDAGES/DRESSINGS) IMPLANT
BIT DRILL 2.0 (BIT) ×2
BIT DRILL 2.4X140 LONG SOLID (BIT) ×1 IMPLANT
BIT DRILL 2.5X2.75 QC CALB (BIT) ×1 IMPLANT
BIT DRILL 2XNS DISP SS SM FRAG (BIT) IMPLANT
BIT DRL 2XNS DISP SS SM FRAG (BIT) ×1
BLADE SURG 15 STRL LF DISP TIS (BLADE) ×4 IMPLANT
BLADE SURG 15 STRL SS (BLADE) ×4
BNDG CMPR 5X4 CHSV STRCH STRL (GAUZE/BANDAGES/DRESSINGS) ×1
BNDG CMPR 9X6 STRL LF SNTH (GAUZE/BANDAGES/DRESSINGS)
BNDG COHESIVE 4X5 TAN STRL LF (GAUZE/BANDAGES/DRESSINGS) ×3 IMPLANT
BNDG ELASTIC 4X5.8 VLCR STR LF (GAUZE/BANDAGES/DRESSINGS) ×3 IMPLANT
BNDG ELASTIC 6X5.8 VLCR STR LF (GAUZE/BANDAGES/DRESSINGS) ×3 IMPLANT
BNDG ESMARK 6X9 LF (GAUZE/BANDAGES/DRESSINGS)
BNDG GAUZE DERMACEA FLUFF (GAUZE/BANDAGES/DRESSINGS) ×1
BNDG GAUZE DERMACEA FLUFF 4 (GAUZE/BANDAGES/DRESSINGS) ×2 IMPLANT
BNDG GZE DERMACEA 4 6PLY (GAUZE/BANDAGES/DRESSINGS) ×1
CANISTER SUCT 1200ML W/VALVE (MISCELLANEOUS) ×3 IMPLANT
CHLORAPREP W/TINT 26 (MISCELLANEOUS) ×3 IMPLANT
COVER BACK TABLE 60X90IN (DRAPES) ×3 IMPLANT
CUFF TOURN SGL QUICK 34 (TOURNIQUET CUFF) ×2
CUFF TRNQT CYL 34X4.125X (TOURNIQUET CUFF) ×2 IMPLANT
DRAPE C-ARM 42X72 X-RAY (DRAPES) ×3 IMPLANT
DRAPE C-ARMOR (DRAPES) ×3 IMPLANT
DRAPE EXTREMITY T 121X128X90 (DISPOSABLE) ×3 IMPLANT
DRAPE IMP U-DRAPE 54X76 (DRAPES) ×3 IMPLANT
DRAPE U-SHAPE 47X51 STRL (DRAPES) ×3 IMPLANT
DRSG PAD ABDOMINAL 8X10 ST (GAUZE/BANDAGES/DRESSINGS) ×15 IMPLANT
ELECT REM PT RETURN 9FT ADLT (ELECTROSURGICAL) ×2
ELECTRODE REM PT RTRN 9FT ADLT (ELECTROSURGICAL) ×2 IMPLANT
GAUZE SPONGE 4X4 12PLY STRL (GAUZE/BANDAGES/DRESSINGS) ×3 IMPLANT
GAUZE XEROFORM 1X8 LF (GAUZE/BANDAGES/DRESSINGS) ×3 IMPLANT
GLOVE BIO SURGEON STRL SZ7.5 (GLOVE) ×3 IMPLANT
GLOVE BIOGEL M STRL SZ7.5 (GLOVE) ×6 IMPLANT
GLOVE BIOGEL PI IND STRL 8 (GLOVE) ×2 IMPLANT
GLOVE BIOGEL PI INDICATOR 8 (GLOVE) ×1
GLOVE SURG SS PI 7.5 STRL IVOR (GLOVE) ×3 IMPLANT
GOWN STRL REUS W/ TWL LRG LVL3 (GOWN DISPOSABLE) ×4 IMPLANT
GOWN STRL REUS W/TWL LRG LVL3 (GOWN DISPOSABLE) ×4
K-WIRE ACE 1.6X6 (WIRE) ×4
KWIRE ACE 1.6X6 (WIRE) IMPLANT
NEEDLE HYPO 22GX1.5 SAFETY (NEEDLE) IMPLANT
NS IRRIG 1000ML POUR BTL (IV SOLUTION) ×3 IMPLANT
PACK BASIN DAY SURGERY FS (CUSTOM PROCEDURE TRAY) ×3 IMPLANT
PAD CAST 4YDX4 CTTN HI CHSV (CAST SUPPLIES) ×2 IMPLANT
PADDING CAST ABS 4INX4YD NS (CAST SUPPLIES)
PADDING CAST ABS COTTON 4X4 ST (CAST SUPPLIES) IMPLANT
PADDING CAST COTTON 4X4 STRL (CAST SUPPLIES) ×2
PADDING CAST COTTON 6X4 STRL (CAST SUPPLIES) ×3 IMPLANT
PADDING CAST SYNTHETIC 4 (CAST SUPPLIES) ×4
PADDING CAST SYNTHETIC 4X4 STR (CAST SUPPLIES) ×8 IMPLANT
PENCIL SMOKE EVACUATOR (MISCELLANEOUS) ×3 IMPLANT
PLATE LOCK 7H 92 BILAT FIB (Plate) ×1 IMPLANT
PLATE MEDIAL MALLEOLUS 4H HOOK (Plate) ×1 IMPLANT
SANITIZER HAND PURELL 535ML FO (MISCELLANEOUS) ×3 IMPLANT
SCREW CORTICAL LOW PROF 3.5X20 (Screw) ×1 IMPLANT
SCREW LOCK 3.5X14 DIST TIB (Screw) ×1 IMPLANT
SCREW LOCK CORT STAR 3.5X12 (Screw) ×1 IMPLANT
SCREW LOCK PLATE R3 2.7X8 (Screw) ×1 IMPLANT
SCREW LOCK PLATE R3 3.5X24 (Screw) ×1 IMPLANT
SCREW NLCK 28X3.5XNS R3CON NON (Screw) IMPLANT
SCREW NON LOCKING LP 3.5 14MM (Screw) ×1 IMPLANT
SCREW NON LOCKING LP 3.5 16MM (Screw) ×2 IMPLANT
SCREW NONLOCK 3.5X28 (Screw) ×2 IMPLANT
SHEET MEDIUM DRAPE 40X70 STRL (DRAPES) ×3 IMPLANT
SLEEVE SCD COMPRESS KNEE MED (STOCKING) ×3 IMPLANT
SPIKE FLUID TRANSFER (MISCELLANEOUS) IMPLANT
SPLINT FAST PLASTER 5X30 (CAST SUPPLIES) ×20
SPLINT PLASTER CAST FAST 5X30 (CAST SUPPLIES) ×40 IMPLANT
SPONGE T-LAP 18X18 ~~LOC~~+RFID (SPONGE) ×3 IMPLANT
STOCKINETTE 6  STRL (DRAPES) ×2
STOCKINETTE 6 STRL (DRAPES) ×2 IMPLANT
SUCTION FRAZIER HANDLE 10FR (MISCELLANEOUS)
SUCTION TUBE FRAZIER 10FR DISP (MISCELLANEOUS) IMPLANT
SUT ETHILON 3 0 PS 1 (SUTURE) ×3 IMPLANT
SUT MNCRL AB 3-0 PS2 18 (SUTURE) ×3 IMPLANT
SUT VIC AB 2-0 SH 27 (SUTURE) ×2
SUT VIC AB 2-0 SH 27XBRD (SUTURE) ×2 IMPLANT
SUT VIC AB 3-0 SH 27 (SUTURE)
SUT VIC AB 3-0 SH 27X BRD (SUTURE) IMPLANT
SUT VICRYL 0 SH 27 (SUTURE) IMPLANT
SYR BULB EAR ULCER 3OZ GRN STR (SYRINGE) ×3 IMPLANT
SYR CONTROL 10ML LL (SYRINGE) IMPLANT
TOWEL GREEN STERILE FF (TOWEL DISPOSABLE) ×6 IMPLANT
TUBE CONNECTING 20X1/4 (TUBING) IMPLANT
UNDERPAD 30X36 HEAVY ABSORB (UNDERPADS AND DIAPERS) ×3 IMPLANT

## 2021-11-16 NOTE — Anesthesia Postprocedure Evaluation (Signed)
Anesthesia Post Note  Patient: Jennifer Evans  Procedure(s) Performed: OPEN REDUCTION INTERNAL FIXATION (ORIF) ANKLE FRACTURE left ankle (Left: Ankle)     Patient location during evaluation: PACU Anesthesia Type: Regional and General Level of consciousness: awake and alert Pain management: pain level controlled Vital Signs Assessment: post-procedure vital signs reviewed and stable Respiratory status: spontaneous breathing, nonlabored ventilation, respiratory function stable and patient connected to nasal cannula oxygen Cardiovascular status: blood pressure returned to baseline and stable Postop Assessment: no apparent nausea or vomiting Anesthetic complications: no   No notable events documented.  Last Vitals:  Vitals:   11/16/21 1151 11/16/21 1158  BP:    Pulse: 85 89  Resp: 15 (!) 21  Temp:    SpO2: 98% 94%    Last Pain:  Vitals:   11/16/21 1224  TempSrc:   PainSc: 6                  Levoy Geisen

## 2021-11-16 NOTE — Anesthesia Procedure Notes (Signed)
Anesthesia Regional Block: Popliteal block   Pre-Anesthetic Checklist: , timeout performed,  Correct Patient, Correct Site, Correct Laterality,  Correct Procedure, Correct Position, site marked,  Risks and benefits discussed,  Surgical consent,  Pre-op evaluation,  At surgeon's request and post-op pain management  Laterality: Left  Prep: chloraprep       Needles:  Injection technique: Single-shot  Needle Type: Echogenic Stimulator Needle     Needle Length: 5cm  Needle Gauge: 22     Additional Needles:   Procedures:, nerve stimulator,,, ultrasound used (permanent image in chart),,     Nerve Stimulator or Paresthesia:  Response: foot, 0.45 mA  Additional Responses:   Narrative:  Start time: 11/16/2021 8:05 AM End time: 11/16/2021 8:10 AM Injection made incrementally with aspirations every 5 mL.  Performed by: Personally  Anesthesiologist: Janeece Riggers, MD  Additional Notes: Functioning IV was confirmed and monitors were applied.  A 36m 22ga Arrow echogenic stimulator needle was used. Sterile prep and drape,hand hygiene and sterile gloves were used. Ultrasound guidance: relevant anatomy identified, needle position confirmed, local anesthetic spread visualized around nerve(s)., vascular puncture avoided.  Image printed for medical record. Negative aspiration and negative test dose prior to incremental administration of local anesthetic. The patient tolerated the procedure well.

## 2021-11-16 NOTE — Anesthesia Procedure Notes (Signed)
Anesthesia Regional Block: Adductor canal block   Pre-Anesthetic Checklist: , timeout performed,  Correct Patient, Correct Site, Correct Laterality,  Correct Procedure, Correct Position, site marked,  Risks and benefits discussed,  Surgical consent,  Pre-op evaluation,  At surgeon's request and post-op pain management  Laterality: Left  Prep: chloraprep       Needles:  Injection technique: Single-shot  Needle Type: Echogenic Stimulator Needle     Needle Length: 5cm  Needle Gauge: 22     Additional Needles:   Procedures:,,,, ultrasound used (permanent image in chart),,    Narrative:  Start time: 11/16/2021 8:00 AM End time: 11/16/2021 8:05 AM Injection made incrementally with aspirations every 5 mL.  Performed by: Personally  Anesthesiologist: Janeece Riggers, MD  Additional Notes: Functioning IV was confirmed and monitors were applied.  A 27m 22ga Arrow echogenic stimulator needle was used. Sterile prep and drape,hand hygiene and sterile gloves were used. Ultrasound guidance: relevant anatomy identified, needle position confirmed, local anesthetic spread visualized around nerve(s)., vascular puncture avoided.  Image printed for medical record. Negative aspiration and negative test dose prior to incremental administration of local anesthetic. The patient tolerated the procedure well.  NO PIK

## 2021-11-16 NOTE — Anesthesia Procedure Notes (Signed)
Procedure Name: LMA Insertion Date/Time: 11/16/2021 8:45 AM  Performed by: Maryella Shivers, CRNAPre-anesthesia Checklist: Patient identified, Emergency Drugs available, Suction available and Patient being monitored Patient Re-evaluated:Patient Re-evaluated prior to induction Oxygen Delivery Method: Circle system utilized Preoxygenation: Pre-oxygenation with 100% oxygen Induction Type: IV induction Ventilation: Mask ventilation without difficulty LMA: LMA inserted LMA Size: 4.0 Number of attempts: 1 Airway Equipment and Method: Bite block Placement Confirmation: positive ETCO2 Tube secured with: Tape Dental Injury: Teeth and Oropharynx as per pre-operative assessment

## 2021-11-16 NOTE — H&P (Signed)
H&P Update: ? ?-History and Physical Reviewed ? ?-Patient has been re-examined ? ?-No change in the plan of care ? ?-The risks and benefits were presented and reviewed. The risks due to hardware failure/irritation, new/persistent infection, stiffness, nerve/vessel/tendon injury, nonunion/malunion, wound healing issues, development of arthritis, failure of this surgery, possibility of external fixation with delayed definitive surgery, need for further surgery, thromboembolic events, anesthesia/medical complications, amputation, death among others were discussed. The patient acknowledged the explanation, agreed to proceed with the plan and a consent was signed. ? ?Jennifer Evans ? ?

## 2021-11-16 NOTE — Progress Notes (Signed)
Assisted Dr. Oddono with left, adductor canal, popliteal, ultrasound guided block. Side rails up, monitors on throughout procedure. See vital signs in flow sheet. Tolerated Procedure well. 

## 2021-11-16 NOTE — Transfer of Care (Signed)
Immediate Anesthesia Transfer of Care Note  Patient: Jennifer Evans  Procedure(s) Performed: OPEN REDUCTION INTERNAL FIXATION (ORIF) ANKLE FRACTURE left ankle (Left: Ankle)  Patient Location: PACU  Anesthesia Type:GA combined with regional for post-op pain  Level of Consciousness: awake, alert  and oriented  Airway & Oxygen Therapy: Patient Spontanous Breathing and Patient connected to face mask oxygen  Post-op Assessment: Report given to RN and Post -op Vital signs reviewed and stable  Post vital signs: Reviewed and stable  Last Vitals:  Vitals Value Taken Time  BP    Temp    Pulse 82 11/16/21 1057  Resp 11 11/16/21 1057  SpO2 96 % 11/16/21 1057  Vitals shown include unvalidated device data.  Last Pain:  Vitals:   11/16/21 0700  TempSrc: Oral  PainSc: 0-No pain      Patients Stated Pain Goal: 3 (63/84/53 6468)  Complications: No notable events documented.

## 2021-11-17 ENCOUNTER — Encounter (HOSPITAL_BASED_OUTPATIENT_CLINIC_OR_DEPARTMENT_OTHER): Payer: Self-pay | Admitting: Orthopaedic Surgery

## 2021-11-17 NOTE — Op Note (Signed)
11/16/2021  10:18 PM   PATIENT: Jennifer Evans  61 y.o. female  MRN: [id number]   PRE-OPERATIVE DIAGNOSIS:   Closed bimalleolar fracture of left ankle   POST-OPERATIVE DIAGNOSIS:   Closed bimalleolar fracture of left ankle   PROCEDURE: Open reduction internal fixation of the left bimalleolar ankle fracture (medial and lateral malleoli)   SURGEON:  Armond Hang, MD   ASSISTANT: None   ANESTHESIA: General, regional   EBL: Minimal   TOURNIQUET:    Total Tourniquet Time Documented: Thigh (Left) - 89 minutes Total: Thigh (Left) - 89 minutes    COMPLICATIONS: None apparent   DISPOSITION: Extubated, awake and stable to recovery.   INDICATION FOR PROCEDURE: The patient presented with left bimalleolar ankle fracture sustained on 11/05/21. She was closed reduced in the ED and followed up as outpatient. 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 Clearance was obtained for surgery from her PCP office.  We discussed the diagnosis, alternative treatment options, risks and benefits of the above surgical intervention, as well as alternative non-operative treatments. All questions/concerns were addressed and the patient/family demonstrated appropriate understanding of the diagnosis, the procedure, the postoperative course, and overall prognosis. The patient wished to proceed with surgical intervention and signed an informed surgical consent as such, in each others presence prior to surgery.   PROCEDURE IN DETAIL: After preoperative consent was obtained and the correct operative site was identified, the patient was brought to the operating room supine on stretcher and transferred onto operating table. General anesthesia was induced. Preoperative antibiotics were administered. Surgical timeout was taken. The patient was then positioned supine with an ipsilateral hip bump. The operative lower extremity was prepped and draped  in standard sterile fashion with a tourniquet around the thigh. The extremity was exsanguinated and the tourniquet was inflated to 275 mmHg.  A standard lateral incision was made over the distal fibula. Dissection was carried down to the level of the fibula and the fracture site identified. The superficial peroneal nerve was identified and protected throughout the procedure. The fibula was noted to be shortened with interposed periosteum. The fibula was brought out to length. The fibula fracture was debrided and the edges defined to achieve cortical read. Reduction maneuver was performed using pointed reduction forceps and lobster forceps. In this manner, the fibula length was restored and fracture reduced. A lag screw was not placed given the orientation of fracture lines and comminution. Due to poor bone quality and extensive comminution at the fracture site, it was decided to use a locking distal fibula plate. We then selected a Zimmer locking plate to match the anatomy of the distal fibula and placed it laterally. This was implanted under intraoperative fluoroscopy with a combination of distal locking screws and proximal cortical & locking screws.  We then made a direct medial ankle approach and extended this proximally in anticipation of implanting a hook plate. Dissection was carried down to the level of the medial malleolar fragment. A dental pick and freer elevator were used to reduce the medial malleolar fragment. Of note, there was extensive comminution of this fragment into multiple segments, all of which had very poor bone quality. A Paragon28 medial distal tibia hook plate was utilized to fix the reduced medial malleolar fragment and the tines were carefully inserted into the distal tip of the malleolus. The plate was oriented to best capture the major fragments of the medial comminution. We placed a non-locking screw in the hook plate  and subsequently implanted two more locking screws proximally  and distally to further secure the plate.  A manual external rotation stress radiograph was obtained and demonstrated symmetry of the ankle mortise with complete stability to testing.  The surgical sites were thoroughly irrigated. The tourniquet was deflated and hemostasis achieved. The deep layers were closed using 2-0 vicryl and the subcutaneous tissues closed using 3-0 monocryl. The skin was closed without tension using 3-0 nylon suture.    The leg was cleaned with saline and sterile adaptic dressings with gauze were applied. A well padded bulky short leg splint was applied. The patient was awakened from anesthesia and transported to the recovery room in stable condition.    FOLLOW UP PLAN: -transfer to PACU, then home -strict NWB operative extremity, maximum elevation -maintain short leg splint until follow up -DVT ppx: Aspirin 81 mg twice daily while NWB -follow up as outpatient in 7-10 days for wound check -sutures out in 2-3 weeks with exchange of short leg splint to short leg cast in outpatient office   RADIOGRAPHS: AP, lateral, oblique and stress radiographs of the left ankle were obtained intraoperatively. These showed interval reduction and fixation of the fractures. Manual stress radiographs were taken and the ankle mortise and tibiofibular relationship were noted to be stable following fixation. All hardware is appropriately positioned and of the appropriate lengths. No other acute injuries are noted.   Armond Hang [facility]

## 2021-11-18 DIAGNOSIS — S82842A Displaced bimalleolar fracture of left lower leg, initial encounter for closed fracture: Secondary | ICD-10-CM | POA: Diagnosis not present

## 2021-11-21 DIAGNOSIS — S82842A Displaced bimalleolar fracture of left lower leg, initial encounter for closed fracture: Secondary | ICD-10-CM | POA: Diagnosis not present

## 2021-11-25 DIAGNOSIS — S82842A Displaced bimalleolar fracture of left lower leg, initial encounter for closed fracture: Secondary | ICD-10-CM | POA: Diagnosis not present

## 2021-12-08 DIAGNOSIS — S82842A Displaced bimalleolar fracture of left lower leg, initial encounter for closed fracture: Secondary | ICD-10-CM | POA: Diagnosis not present

## 2021-12-20 DIAGNOSIS — M4184 Other forms of scoliosis, thoracic region: Secondary | ICD-10-CM | POA: Diagnosis not present

## 2021-12-20 DIAGNOSIS — M9901 Segmental and somatic dysfunction of cervical region: Secondary | ICD-10-CM | POA: Diagnosis not present

## 2021-12-20 DIAGNOSIS — M5134 Other intervertebral disc degeneration, thoracic region: Secondary | ICD-10-CM | POA: Diagnosis not present

## 2021-12-20 DIAGNOSIS — M9903 Segmental and somatic dysfunction of lumbar region: Secondary | ICD-10-CM | POA: Diagnosis not present

## 2021-12-27 DIAGNOSIS — S82842A Displaced bimalleolar fracture of left lower leg, initial encounter for closed fracture: Secondary | ICD-10-CM | POA: Diagnosis not present

## 2021-12-29 DIAGNOSIS — M9903 Segmental and somatic dysfunction of lumbar region: Secondary | ICD-10-CM | POA: Diagnosis not present

## 2021-12-29 DIAGNOSIS — M5134 Other intervertebral disc degeneration, thoracic region: Secondary | ICD-10-CM | POA: Diagnosis not present

## 2021-12-29 DIAGNOSIS — M4184 Other forms of scoliosis, thoracic region: Secondary | ICD-10-CM | POA: Diagnosis not present

## 2021-12-29 DIAGNOSIS — M9901 Segmental and somatic dysfunction of cervical region: Secondary | ICD-10-CM | POA: Diagnosis not present

## 2022-01-03 DIAGNOSIS — C73 Malignant neoplasm of thyroid gland: Secondary | ICD-10-CM | POA: Diagnosis not present

## 2022-01-03 DIAGNOSIS — E890 Postprocedural hypothyroidism: Secondary | ICD-10-CM | POA: Diagnosis not present

## 2022-01-09 DIAGNOSIS — E890 Postprocedural hypothyroidism: Secondary | ICD-10-CM | POA: Diagnosis not present

## 2022-01-09 DIAGNOSIS — T148XXA Other injury of unspecified body region, initial encounter: Secondary | ICD-10-CM | POA: Diagnosis not present

## 2022-01-09 DIAGNOSIS — M8580 Other specified disorders of bone density and structure, unspecified site: Secondary | ICD-10-CM | POA: Diagnosis not present

## 2022-01-09 DIAGNOSIS — C73 Malignant neoplasm of thyroid gland: Secondary | ICD-10-CM | POA: Diagnosis not present

## 2022-01-10 DIAGNOSIS — M25672 Stiffness of left ankle, not elsewhere classified: Secondary | ICD-10-CM | POA: Diagnosis not present

## 2022-01-11 DIAGNOSIS — M9903 Segmental and somatic dysfunction of lumbar region: Secondary | ICD-10-CM | POA: Diagnosis not present

## 2022-01-11 DIAGNOSIS — M5134 Other intervertebral disc degeneration, thoracic region: Secondary | ICD-10-CM | POA: Diagnosis not present

## 2022-01-11 DIAGNOSIS — M4184 Other forms of scoliosis, thoracic region: Secondary | ICD-10-CM | POA: Diagnosis not present

## 2022-01-11 DIAGNOSIS — M9901 Segmental and somatic dysfunction of cervical region: Secondary | ICD-10-CM | POA: Diagnosis not present

## 2022-01-12 DIAGNOSIS — M25672 Stiffness of left ankle, not elsewhere classified: Secondary | ICD-10-CM | POA: Diagnosis not present

## 2022-01-16 DIAGNOSIS — M25672 Stiffness of left ankle, not elsewhere classified: Secondary | ICD-10-CM | POA: Diagnosis not present

## 2022-01-19 DIAGNOSIS — M25672 Stiffness of left ankle, not elsewhere classified: Secondary | ICD-10-CM | POA: Diagnosis not present

## 2022-01-23 DIAGNOSIS — M25672 Stiffness of left ankle, not elsewhere classified: Secondary | ICD-10-CM | POA: Diagnosis not present

## 2022-01-24 DIAGNOSIS — M4184 Other forms of scoliosis, thoracic region: Secondary | ICD-10-CM | POA: Diagnosis not present

## 2022-01-24 DIAGNOSIS — M5134 Other intervertebral disc degeneration, thoracic region: Secondary | ICD-10-CM | POA: Diagnosis not present

## 2022-01-24 DIAGNOSIS — M9901 Segmental and somatic dysfunction of cervical region: Secondary | ICD-10-CM | POA: Diagnosis not present

## 2022-01-24 DIAGNOSIS — M9903 Segmental and somatic dysfunction of lumbar region: Secondary | ICD-10-CM | POA: Diagnosis not present

## 2022-01-25 ENCOUNTER — Other Ambulatory Visit: Payer: Self-pay | Admitting: Endocrinology

## 2022-01-25 DIAGNOSIS — C73 Malignant neoplasm of thyroid gland: Secondary | ICD-10-CM

## 2022-01-25 DIAGNOSIS — M25672 Stiffness of left ankle, not elsewhere classified: Secondary | ICD-10-CM | POA: Diagnosis not present

## 2022-01-31 ENCOUNTER — Ambulatory Visit
Admission: RE | Admit: 2022-01-31 | Discharge: 2022-01-31 | Disposition: A | Discharge status: Home / Self Care | Payer: BC Managed Care – PPO | Source: Ambulatory Visit | Attending: Obstetrics and Gynecology | Admitting: Obstetrics and Gynecology

## 2022-01-31 DIAGNOSIS — Z1231 Encounter for screening mammogram for malignant neoplasm of breast: Secondary | ICD-10-CM | POA: Diagnosis not present

## 2022-02-01 DIAGNOSIS — M25672 Stiffness of left ankle, not elsewhere classified: Secondary | ICD-10-CM | POA: Diagnosis not present

## 2022-02-03 DIAGNOSIS — M25672 Stiffness of left ankle, not elsewhere classified: Secondary | ICD-10-CM | POA: Diagnosis not present

## 2022-02-06 DIAGNOSIS — M25672 Stiffness of left ankle, not elsewhere classified: Secondary | ICD-10-CM | POA: Diagnosis not present

## 2022-02-07 DIAGNOSIS — S82842A Displaced bimalleolar fracture of left lower leg, initial encounter for closed fracture: Secondary | ICD-10-CM | POA: Diagnosis not present

## 2022-02-08 DIAGNOSIS — M4184 Other forms of scoliosis, thoracic region: Secondary | ICD-10-CM | POA: Diagnosis not present

## 2022-02-08 DIAGNOSIS — M9901 Segmental and somatic dysfunction of cervical region: Secondary | ICD-10-CM | POA: Diagnosis not present

## 2022-02-08 DIAGNOSIS — M5134 Other intervertebral disc degeneration, thoracic region: Secondary | ICD-10-CM | POA: Diagnosis not present

## 2022-02-08 DIAGNOSIS — M9903 Segmental and somatic dysfunction of lumbar region: Secondary | ICD-10-CM | POA: Diagnosis not present

## 2022-02-09 DIAGNOSIS — M25672 Stiffness of left ankle, not elsewhere classified: Secondary | ICD-10-CM | POA: Diagnosis not present

## 2022-02-10 DIAGNOSIS — E782 Mixed hyperlipidemia: Secondary | ICD-10-CM | POA: Diagnosis not present

## 2022-02-10 DIAGNOSIS — Z0000 Encounter for general adult medical examination without abnormal findings: Secondary | ICD-10-CM | POA: Diagnosis not present

## 2022-02-10 DIAGNOSIS — J4540 Moderate persistent asthma, uncomplicated: Secondary | ICD-10-CM | POA: Diagnosis not present

## 2022-02-10 DIAGNOSIS — M8580 Other specified disorders of bone density and structure, unspecified site: Secondary | ICD-10-CM | POA: Diagnosis not present

## 2022-02-10 DIAGNOSIS — I10 Essential (primary) hypertension: Secondary | ICD-10-CM | POA: Diagnosis not present

## 2022-02-14 DIAGNOSIS — M25672 Stiffness of left ankle, not elsewhere classified: Secondary | ICD-10-CM | POA: Diagnosis not present

## 2022-02-17 DIAGNOSIS — M25672 Stiffness of left ankle, not elsewhere classified: Secondary | ICD-10-CM | POA: Diagnosis not present

## 2022-02-21 DIAGNOSIS — Z6826 Body mass index (BMI) 26.0-26.9, adult: Secondary | ICD-10-CM | POA: Diagnosis not present

## 2022-02-21 DIAGNOSIS — Z803 Family history of malignant neoplasm of breast: Secondary | ICD-10-CM | POA: Diagnosis not present

## 2022-02-21 DIAGNOSIS — M9903 Segmental and somatic dysfunction of lumbar region: Secondary | ICD-10-CM | POA: Diagnosis not present

## 2022-02-21 DIAGNOSIS — M25672 Stiffness of left ankle, not elsewhere classified: Secondary | ICD-10-CM | POA: Diagnosis not present

## 2022-02-21 DIAGNOSIS — M4184 Other forms of scoliosis, thoracic region: Secondary | ICD-10-CM | POA: Diagnosis not present

## 2022-02-21 DIAGNOSIS — M5134 Other intervertebral disc degeneration, thoracic region: Secondary | ICD-10-CM | POA: Diagnosis not present

## 2022-02-21 DIAGNOSIS — Z85850 Personal history of malignant neoplasm of thyroid: Secondary | ICD-10-CM | POA: Diagnosis not present

## 2022-02-21 DIAGNOSIS — Z01419 Encounter for gynecological examination (general) (routine) without abnormal findings: Secondary | ICD-10-CM | POA: Diagnosis not present

## 2022-02-21 DIAGNOSIS — M9901 Segmental and somatic dysfunction of cervical region: Secondary | ICD-10-CM | POA: Diagnosis not present

## 2022-02-24 DIAGNOSIS — M25672 Stiffness of left ankle, not elsewhere classified: Secondary | ICD-10-CM | POA: Diagnosis not present

## 2022-02-28 DIAGNOSIS — S82842A Displaced bimalleolar fracture of left lower leg, initial encounter for closed fracture: Secondary | ICD-10-CM | POA: Diagnosis not present

## 2022-03-06 DIAGNOSIS — M25672 Stiffness of left ankle, not elsewhere classified: Secondary | ICD-10-CM | POA: Diagnosis not present

## 2022-03-08 DIAGNOSIS — M9903 Segmental and somatic dysfunction of lumbar region: Secondary | ICD-10-CM | POA: Diagnosis not present

## 2022-03-08 DIAGNOSIS — M5134 Other intervertebral disc degeneration, thoracic region: Secondary | ICD-10-CM | POA: Diagnosis not present

## 2022-03-08 DIAGNOSIS — M4184 Other forms of scoliosis, thoracic region: Secondary | ICD-10-CM | POA: Diagnosis not present

## 2022-03-08 DIAGNOSIS — M9901 Segmental and somatic dysfunction of cervical region: Secondary | ICD-10-CM | POA: Diagnosis not present

## 2022-03-09 DIAGNOSIS — Z03818 Encounter for observation for suspected exposure to other biological agents ruled out: Secondary | ICD-10-CM | POA: Diagnosis not present

## 2022-03-09 DIAGNOSIS — J069 Acute upper respiratory infection, unspecified: Secondary | ICD-10-CM | POA: Diagnosis not present

## 2022-03-09 DIAGNOSIS — H6121 Impacted cerumen, right ear: Secondary | ICD-10-CM | POA: Diagnosis not present

## 2022-03-10 DIAGNOSIS — M25672 Stiffness of left ankle, not elsewhere classified: Secondary | ICD-10-CM | POA: Diagnosis not present

## 2022-03-14 DIAGNOSIS — C73 Malignant neoplasm of thyroid gland: Secondary | ICD-10-CM | POA: Diagnosis not present

## 2022-03-14 DIAGNOSIS — E890 Postprocedural hypothyroidism: Secondary | ICD-10-CM | POA: Diagnosis not present

## 2022-03-16 DIAGNOSIS — M9901 Segmental and somatic dysfunction of cervical region: Secondary | ICD-10-CM | POA: Diagnosis not present

## 2022-03-16 DIAGNOSIS — M4184 Other forms of scoliosis, thoracic region: Secondary | ICD-10-CM | POA: Diagnosis not present

## 2022-03-16 DIAGNOSIS — M9903 Segmental and somatic dysfunction of lumbar region: Secondary | ICD-10-CM | POA: Diagnosis not present

## 2022-03-16 DIAGNOSIS — M5134 Other intervertebral disc degeneration, thoracic region: Secondary | ICD-10-CM | POA: Diagnosis not present

## 2022-03-23 DIAGNOSIS — M9903 Segmental and somatic dysfunction of lumbar region: Secondary | ICD-10-CM | POA: Diagnosis not present

## 2022-03-23 DIAGNOSIS — M5134 Other intervertebral disc degeneration, thoracic region: Secondary | ICD-10-CM | POA: Diagnosis not present

## 2022-03-23 DIAGNOSIS — M9901 Segmental and somatic dysfunction of cervical region: Secondary | ICD-10-CM | POA: Diagnosis not present

## 2022-03-23 DIAGNOSIS — M4184 Other forms of scoliosis, thoracic region: Secondary | ICD-10-CM | POA: Diagnosis not present

## 2022-03-28 DIAGNOSIS — Z23 Encounter for immunization: Secondary | ICD-10-CM | POA: Diagnosis not present

## 2022-03-30 DIAGNOSIS — M4184 Other forms of scoliosis, thoracic region: Secondary | ICD-10-CM | POA: Diagnosis not present

## 2022-03-30 DIAGNOSIS — M9901 Segmental and somatic dysfunction of cervical region: Secondary | ICD-10-CM | POA: Diagnosis not present

## 2022-03-30 DIAGNOSIS — M9903 Segmental and somatic dysfunction of lumbar region: Secondary | ICD-10-CM | POA: Diagnosis not present

## 2022-03-30 DIAGNOSIS — M5134 Other intervertebral disc degeneration, thoracic region: Secondary | ICD-10-CM | POA: Diagnosis not present

## 2022-04-06 DIAGNOSIS — M4184 Other forms of scoliosis, thoracic region: Secondary | ICD-10-CM | POA: Diagnosis not present

## 2022-04-06 DIAGNOSIS — M9901 Segmental and somatic dysfunction of cervical region: Secondary | ICD-10-CM | POA: Diagnosis not present

## 2022-04-06 DIAGNOSIS — M9903 Segmental and somatic dysfunction of lumbar region: Secondary | ICD-10-CM | POA: Diagnosis not present

## 2022-04-06 DIAGNOSIS — M5134 Other intervertebral disc degeneration, thoracic region: Secondary | ICD-10-CM | POA: Diagnosis not present

## 2022-05-15 DIAGNOSIS — H16042 Marginal corneal ulcer, left eye: Secondary | ICD-10-CM | POA: Diagnosis not present

## 2022-05-24 DIAGNOSIS — H18232 Secondary corneal edema, left eye: Secondary | ICD-10-CM | POA: Diagnosis not present

## 2022-05-26 DIAGNOSIS — F419 Anxiety disorder, unspecified: Secondary | ICD-10-CM | POA: Diagnosis not present

## 2022-05-26 DIAGNOSIS — E782 Mixed hyperlipidemia: Secondary | ICD-10-CM | POA: Diagnosis not present

## 2022-05-31 ENCOUNTER — Other Ambulatory Visit: Payer: Self-pay | Admitting: Endocrinology

## 2022-05-31 DIAGNOSIS — C73 Malignant neoplasm of thyroid gland: Secondary | ICD-10-CM

## 2022-06-06 DIAGNOSIS — M8580 Other specified disorders of bone density and structure, unspecified site: Secondary | ICD-10-CM | POA: Diagnosis not present

## 2022-06-06 DIAGNOSIS — C73 Malignant neoplasm of thyroid gland: Secondary | ICD-10-CM | POA: Diagnosis not present

## 2022-06-06 DIAGNOSIS — E890 Postprocedural hypothyroidism: Secondary | ICD-10-CM | POA: Diagnosis not present

## 2022-06-06 DIAGNOSIS — Z13820 Encounter for screening for osteoporosis: Secondary | ICD-10-CM | POA: Diagnosis not present

## 2022-06-06 DIAGNOSIS — I10 Essential (primary) hypertension: Secondary | ICD-10-CM | POA: Diagnosis not present

## 2022-06-08 DIAGNOSIS — M4184 Other forms of scoliosis, thoracic region: Secondary | ICD-10-CM | POA: Diagnosis not present

## 2022-06-08 DIAGNOSIS — M9903 Segmental and somatic dysfunction of lumbar region: Secondary | ICD-10-CM | POA: Diagnosis not present

## 2022-06-08 DIAGNOSIS — M9901 Segmental and somatic dysfunction of cervical region: Secondary | ICD-10-CM | POA: Diagnosis not present

## 2022-06-08 DIAGNOSIS — M5134 Other intervertebral disc degeneration, thoracic region: Secondary | ICD-10-CM | POA: Diagnosis not present

## 2022-06-13 DIAGNOSIS — I10 Essential (primary) hypertension: Secondary | ICD-10-CM | POA: Diagnosis not present

## 2022-06-13 DIAGNOSIS — M8580 Other specified disorders of bone density and structure, unspecified site: Secondary | ICD-10-CM | POA: Diagnosis not present

## 2022-06-13 DIAGNOSIS — C73 Malignant neoplasm of thyroid gland: Secondary | ICD-10-CM | POA: Diagnosis not present

## 2022-06-13 DIAGNOSIS — E890 Postprocedural hypothyroidism: Secondary | ICD-10-CM | POA: Diagnosis not present

## 2022-06-22 DIAGNOSIS — K589 Irritable bowel syndrome without diarrhea: Secondary | ICD-10-CM | POA: Diagnosis not present

## 2022-06-22 DIAGNOSIS — R159 Full incontinence of feces: Secondary | ICD-10-CM | POA: Diagnosis not present

## 2022-06-27 ENCOUNTER — Ambulatory Visit
Admission: RE | Admit: 2022-06-27 | Discharge: 2022-06-27 | Disposition: A | Discharge status: Home / Self Care | Payer: BC Managed Care – PPO | Source: Ambulatory Visit | Attending: Endocrinology | Admitting: Endocrinology

## 2022-06-27 DIAGNOSIS — E890 Postprocedural hypothyroidism: Secondary | ICD-10-CM | POA: Diagnosis not present

## 2022-06-27 DIAGNOSIS — C73 Malignant neoplasm of thyroid gland: Secondary | ICD-10-CM

## 2022-07-19 DIAGNOSIS — M9901 Segmental and somatic dysfunction of cervical region: Secondary | ICD-10-CM | POA: Diagnosis not present

## 2022-07-19 DIAGNOSIS — M5134 Other intervertebral disc degeneration, thoracic region: Secondary | ICD-10-CM | POA: Diagnosis not present

## 2022-07-19 DIAGNOSIS — M9903 Segmental and somatic dysfunction of lumbar region: Secondary | ICD-10-CM | POA: Diagnosis not present

## 2022-07-19 DIAGNOSIS — M4184 Other forms of scoliosis, thoracic region: Secondary | ICD-10-CM | POA: Diagnosis not present

## 2022-08-02 DIAGNOSIS — M9903 Segmental and somatic dysfunction of lumbar region: Secondary | ICD-10-CM | POA: Diagnosis not present

## 2022-08-02 DIAGNOSIS — M4184 Other forms of scoliosis, thoracic region: Secondary | ICD-10-CM | POA: Diagnosis not present

## 2022-08-02 DIAGNOSIS — M5134 Other intervertebral disc degeneration, thoracic region: Secondary | ICD-10-CM | POA: Diagnosis not present

## 2022-08-02 DIAGNOSIS — M9901 Segmental and somatic dysfunction of cervical region: Secondary | ICD-10-CM | POA: Diagnosis not present

## 2022-08-17 DIAGNOSIS — M4184 Other forms of scoliosis, thoracic region: Secondary | ICD-10-CM | POA: Diagnosis not present

## 2022-08-17 DIAGNOSIS — M9901 Segmental and somatic dysfunction of cervical region: Secondary | ICD-10-CM | POA: Diagnosis not present

## 2022-08-17 DIAGNOSIS — M9903 Segmental and somatic dysfunction of lumbar region: Secondary | ICD-10-CM | POA: Diagnosis not present

## 2022-08-17 DIAGNOSIS — M5134 Other intervertebral disc degeneration, thoracic region: Secondary | ICD-10-CM | POA: Diagnosis not present

## 2022-09-01 DIAGNOSIS — U071 COVID-19: Secondary | ICD-10-CM | POA: Diagnosis not present

## 2022-09-12 DIAGNOSIS — M9903 Segmental and somatic dysfunction of lumbar region: Secondary | ICD-10-CM | POA: Diagnosis not present

## 2022-09-12 DIAGNOSIS — M9901 Segmental and somatic dysfunction of cervical region: Secondary | ICD-10-CM | POA: Diagnosis not present

## 2022-09-12 DIAGNOSIS — M4184 Other forms of scoliosis, thoracic region: Secondary | ICD-10-CM | POA: Diagnosis not present

## 2022-09-12 DIAGNOSIS — M5134 Other intervertebral disc degeneration, thoracic region: Secondary | ICD-10-CM | POA: Diagnosis not present

## 2022-09-15 DIAGNOSIS — K219 Gastro-esophageal reflux disease without esophagitis: Secondary | ICD-10-CM | POA: Diagnosis not present

## 2022-09-15 DIAGNOSIS — J309 Allergic rhinitis, unspecified: Secondary | ICD-10-CM | POA: Diagnosis not present

## 2022-09-15 DIAGNOSIS — J4540 Moderate persistent asthma, uncomplicated: Secondary | ICD-10-CM | POA: Diagnosis not present

## 2022-10-30 DIAGNOSIS — L821 Other seborrheic keratosis: Secondary | ICD-10-CM | POA: Diagnosis not present

## 2022-11-01 DIAGNOSIS — L814 Other melanin hyperpigmentation: Secondary | ICD-10-CM | POA: Diagnosis not present

## 2022-11-01 DIAGNOSIS — D2262 Melanocytic nevi of left upper limb, including shoulder: Secondary | ICD-10-CM | POA: Diagnosis not present

## 2022-11-01 DIAGNOSIS — L815 Leukoderma, not elsewhere classified: Secondary | ICD-10-CM | POA: Diagnosis not present

## 2022-11-01 DIAGNOSIS — L821 Other seborrheic keratosis: Secondary | ICD-10-CM | POA: Diagnosis not present

## 2022-12-14 DIAGNOSIS — C73 Malignant neoplasm of thyroid gland: Secondary | ICD-10-CM | POA: Diagnosis not present

## 2022-12-14 DIAGNOSIS — M8580 Other specified disorders of bone density and structure, unspecified site: Secondary | ICD-10-CM | POA: Diagnosis not present

## 2022-12-14 DIAGNOSIS — E890 Postprocedural hypothyroidism: Secondary | ICD-10-CM | POA: Diagnosis not present

## 2023-01-01 ENCOUNTER — Other Ambulatory Visit: Payer: Self-pay | Admitting: Obstetrics and Gynecology

## 2023-01-01 DIAGNOSIS — Z1231 Encounter for screening mammogram for malignant neoplasm of breast: Secondary | ICD-10-CM

## 2023-01-04 DIAGNOSIS — H53143 Visual discomfort, bilateral: Secondary | ICD-10-CM | POA: Diagnosis not present

## 2023-01-15 DIAGNOSIS — M7661 Achilles tendinitis, right leg: Secondary | ICD-10-CM | POA: Diagnosis not present

## 2023-01-18 DIAGNOSIS — M7661 Achilles tendinitis, right leg: Secondary | ICD-10-CM | POA: Diagnosis not present

## 2023-01-26 DIAGNOSIS — M9901 Segmental and somatic dysfunction of cervical region: Secondary | ICD-10-CM | POA: Diagnosis not present

## 2023-01-26 DIAGNOSIS — M9903 Segmental and somatic dysfunction of lumbar region: Secondary | ICD-10-CM | POA: Diagnosis not present

## 2023-01-26 DIAGNOSIS — M7671 Peroneal tendinitis, right leg: Secondary | ICD-10-CM | POA: Diagnosis not present

## 2023-01-26 DIAGNOSIS — M9906 Segmental and somatic dysfunction of lower extremity: Secondary | ICD-10-CM | POA: Diagnosis not present

## 2023-01-29 DIAGNOSIS — M9903 Segmental and somatic dysfunction of lumbar region: Secondary | ICD-10-CM | POA: Diagnosis not present

## 2023-01-29 DIAGNOSIS — M9906 Segmental and somatic dysfunction of lower extremity: Secondary | ICD-10-CM | POA: Diagnosis not present

## 2023-01-29 DIAGNOSIS — M7671 Peroneal tendinitis, right leg: Secondary | ICD-10-CM | POA: Diagnosis not present

## 2023-01-29 DIAGNOSIS — M9901 Segmental and somatic dysfunction of cervical region: Secondary | ICD-10-CM | POA: Diagnosis not present

## 2023-02-02 ENCOUNTER — Ambulatory Visit: Payer: BC Managed Care – PPO

## 2023-02-12 DIAGNOSIS — Z13220 Encounter for screening for lipoid disorders: Secondary | ICD-10-CM | POA: Diagnosis not present

## 2023-02-12 DIAGNOSIS — I10 Essential (primary) hypertension: Secondary | ICD-10-CM | POA: Diagnosis not present

## 2023-02-12 DIAGNOSIS — K219 Gastro-esophageal reflux disease without esophagitis: Secondary | ICD-10-CM | POA: Diagnosis not present

## 2023-02-12 DIAGNOSIS — E782 Mixed hyperlipidemia: Secondary | ICD-10-CM | POA: Diagnosis not present

## 2023-02-12 DIAGNOSIS — M8580 Other specified disorders of bone density and structure, unspecified site: Secondary | ICD-10-CM | POA: Diagnosis not present

## 2023-02-14 DIAGNOSIS — I10 Essential (primary) hypertension: Secondary | ICD-10-CM | POA: Diagnosis not present

## 2023-02-14 DIAGNOSIS — Z0000 Encounter for general adult medical examination without abnormal findings: Secondary | ICD-10-CM | POA: Diagnosis not present

## 2023-02-14 DIAGNOSIS — J4540 Moderate persistent asthma, uncomplicated: Secondary | ICD-10-CM | POA: Diagnosis not present

## 2023-02-14 DIAGNOSIS — Z23 Encounter for immunization: Secondary | ICD-10-CM | POA: Diagnosis not present

## 2023-02-14 DIAGNOSIS — E782 Mixed hyperlipidemia: Secondary | ICD-10-CM | POA: Diagnosis not present

## 2023-02-14 DIAGNOSIS — K219 Gastro-esophageal reflux disease without esophagitis: Secondary | ICD-10-CM | POA: Diagnosis not present

## 2023-02-20 ENCOUNTER — Ambulatory Visit
Admission: RE | Admit: 2023-02-20 | Discharge: 2023-02-20 | Disposition: A | Discharge status: Home / Self Care | Payer: BC Managed Care – PPO | Source: Ambulatory Visit | Attending: Obstetrics and Gynecology | Admitting: Obstetrics and Gynecology

## 2023-02-20 DIAGNOSIS — M7671 Peroneal tendinitis, right leg: Secondary | ICD-10-CM | POA: Diagnosis not present

## 2023-02-20 DIAGNOSIS — Z1231 Encounter for screening mammogram for malignant neoplasm of breast: Secondary | ICD-10-CM

## 2023-02-20 DIAGNOSIS — M9901 Segmental and somatic dysfunction of cervical region: Secondary | ICD-10-CM | POA: Diagnosis not present

## 2023-02-20 DIAGNOSIS — M9903 Segmental and somatic dysfunction of lumbar region: Secondary | ICD-10-CM | POA: Diagnosis not present

## 2023-02-20 DIAGNOSIS — M9906 Segmental and somatic dysfunction of lower extremity: Secondary | ICD-10-CM | POA: Diagnosis not present

## 2023-02-27 DIAGNOSIS — Z01419 Encounter for gynecological examination (general) (routine) without abnormal findings: Secondary | ICD-10-CM | POA: Diagnosis not present

## 2023-02-27 DIAGNOSIS — Z6824 Body mass index (BMI) 24.0-24.9, adult: Secondary | ICD-10-CM | POA: Diagnosis not present

## 2023-03-22 DIAGNOSIS — M9901 Segmental and somatic dysfunction of cervical region: Secondary | ICD-10-CM | POA: Diagnosis not present

## 2023-03-22 DIAGNOSIS — M7671 Peroneal tendinitis, right leg: Secondary | ICD-10-CM | POA: Diagnosis not present

## 2023-03-22 DIAGNOSIS — M9903 Segmental and somatic dysfunction of lumbar region: Secondary | ICD-10-CM | POA: Diagnosis not present

## 2023-03-22 DIAGNOSIS — M9906 Segmental and somatic dysfunction of lower extremity: Secondary | ICD-10-CM | POA: Diagnosis not present

## 2023-04-17 DIAGNOSIS — J069 Acute upper respiratory infection, unspecified: Secondary | ICD-10-CM | POA: Diagnosis not present

## 2023-04-17 DIAGNOSIS — Z03818 Encounter for observation for suspected exposure to other biological agents ruled out: Secondary | ICD-10-CM | POA: Diagnosis not present

## 2023-04-17 DIAGNOSIS — R051 Acute cough: Secondary | ICD-10-CM | POA: Diagnosis not present

## 2023-04-24 DIAGNOSIS — M9903 Segmental and somatic dysfunction of lumbar region: Secondary | ICD-10-CM | POA: Diagnosis not present

## 2023-04-24 DIAGNOSIS — M9906 Segmental and somatic dysfunction of lower extremity: Secondary | ICD-10-CM | POA: Diagnosis not present

## 2023-04-24 DIAGNOSIS — M7671 Peroneal tendinitis, right leg: Secondary | ICD-10-CM | POA: Diagnosis not present

## 2023-04-24 DIAGNOSIS — M9901 Segmental and somatic dysfunction of cervical region: Secondary | ICD-10-CM | POA: Diagnosis not present

## 2023-05-22 DIAGNOSIS — M9903 Segmental and somatic dysfunction of lumbar region: Secondary | ICD-10-CM | POA: Diagnosis not present

## 2023-05-22 DIAGNOSIS — M9901 Segmental and somatic dysfunction of cervical region: Secondary | ICD-10-CM | POA: Diagnosis not present

## 2023-05-22 DIAGNOSIS — M7671 Peroneal tendinitis, right leg: Secondary | ICD-10-CM | POA: Diagnosis not present

## 2023-05-22 DIAGNOSIS — M9906 Segmental and somatic dysfunction of lower extremity: Secondary | ICD-10-CM | POA: Diagnosis not present

## 2023-05-31 DIAGNOSIS — L2389 Allergic contact dermatitis due to other agents: Secondary | ICD-10-CM | POA: Diagnosis not present

## 2023-06-13 DIAGNOSIS — M8580 Other specified disorders of bone density and structure, unspecified site: Secondary | ICD-10-CM | POA: Diagnosis not present

## 2023-06-13 DIAGNOSIS — E890 Postprocedural hypothyroidism: Secondary | ICD-10-CM | POA: Diagnosis not present

## 2023-06-13 DIAGNOSIS — C73 Malignant neoplasm of thyroid gland: Secondary | ICD-10-CM | POA: Diagnosis not present

## 2023-06-20 ENCOUNTER — Other Ambulatory Visit: Payer: Self-pay | Admitting: Endocrinology

## 2023-06-20 DIAGNOSIS — C73 Malignant neoplasm of thyroid gland: Secondary | ICD-10-CM

## 2023-06-20 DIAGNOSIS — I10 Essential (primary) hypertension: Secondary | ICD-10-CM | POA: Diagnosis not present

## 2023-06-20 DIAGNOSIS — E890 Postprocedural hypothyroidism: Secondary | ICD-10-CM | POA: Diagnosis not present

## 2023-06-20 DIAGNOSIS — M8580 Other specified disorders of bone density and structure, unspecified site: Secondary | ICD-10-CM | POA: Diagnosis not present

## 2023-06-27 ENCOUNTER — Ambulatory Visit
Admission: RE | Admit: 2023-06-27 | Discharge: 2023-06-27 | Disposition: A | Discharge status: Home / Self Care | Source: Ambulatory Visit | Attending: Endocrinology | Admitting: Endocrinology

## 2023-06-27 DIAGNOSIS — Z85850 Personal history of malignant neoplasm of thyroid: Secondary | ICD-10-CM | POA: Diagnosis not present

## 2023-06-27 DIAGNOSIS — C73 Malignant neoplasm of thyroid gland: Secondary | ICD-10-CM

## 2023-08-07 DIAGNOSIS — M9906 Segmental and somatic dysfunction of lower extremity: Secondary | ICD-10-CM | POA: Diagnosis not present

## 2023-08-07 DIAGNOSIS — M9903 Segmental and somatic dysfunction of lumbar region: Secondary | ICD-10-CM | POA: Diagnosis not present

## 2023-08-07 DIAGNOSIS — M9901 Segmental and somatic dysfunction of cervical region: Secondary | ICD-10-CM | POA: Diagnosis not present

## 2023-08-07 DIAGNOSIS — M9902 Segmental and somatic dysfunction of thoracic region: Secondary | ICD-10-CM | POA: Diagnosis not present

## 2023-08-14 DIAGNOSIS — M8580 Other specified disorders of bone density and structure, unspecified site: Secondary | ICD-10-CM | POA: Diagnosis not present

## 2023-08-14 DIAGNOSIS — R0981 Nasal congestion: Secondary | ICD-10-CM | POA: Diagnosis not present

## 2023-08-14 DIAGNOSIS — F419 Anxiety disorder, unspecified: Secondary | ICD-10-CM | POA: Diagnosis not present

## 2023-08-14 DIAGNOSIS — E782 Mixed hyperlipidemia: Secondary | ICD-10-CM | POA: Diagnosis not present

## 2023-08-14 DIAGNOSIS — I10 Essential (primary) hypertension: Secondary | ICD-10-CM | POA: Diagnosis not present

## 2023-08-14 DIAGNOSIS — J4540 Moderate persistent asthma, uncomplicated: Secondary | ICD-10-CM | POA: Diagnosis not present

## 2023-08-16 DIAGNOSIS — M9902 Segmental and somatic dysfunction of thoracic region: Secondary | ICD-10-CM | POA: Diagnosis not present

## 2023-08-16 DIAGNOSIS — M9901 Segmental and somatic dysfunction of cervical region: Secondary | ICD-10-CM | POA: Diagnosis not present

## 2023-08-16 DIAGNOSIS — M9903 Segmental and somatic dysfunction of lumbar region: Secondary | ICD-10-CM | POA: Diagnosis not present

## 2023-08-16 DIAGNOSIS — M9906 Segmental and somatic dysfunction of lower extremity: Secondary | ICD-10-CM | POA: Diagnosis not present

## 2023-09-05 ENCOUNTER — Ambulatory Visit (INDEPENDENT_AMBULATORY_CARE_PROVIDER_SITE_OTHER)

## 2023-09-05 ENCOUNTER — Ambulatory Visit: Admitting: Nurse Practitioner

## 2023-09-05 ENCOUNTER — Encounter: Payer: Self-pay | Admitting: Nurse Practitioner

## 2023-09-05 VITALS — BP 134/82 | HR 79 | Ht 63.0 in | Wt 136.0 lb

## 2023-09-05 DIAGNOSIS — J4531 Mild persistent asthma with (acute) exacerbation: Secondary | ICD-10-CM

## 2023-09-05 DIAGNOSIS — R058 Other specified cough: Secondary | ICD-10-CM | POA: Diagnosis not present

## 2023-09-05 DIAGNOSIS — J069 Acute upper respiratory infection, unspecified: Secondary | ICD-10-CM | POA: Diagnosis not present

## 2023-09-05 DIAGNOSIS — J040 Acute laryngitis: Secondary | ICD-10-CM

## 2023-09-05 LAB — POCT EXHALED NITRIC OXIDE: FeNO level (ppb): 29

## 2023-09-05 MED ORDER — DOXYCYCLINE HYCLATE 100 MG PO TABS: 100.0000 mg | ORAL_TABLET | Freq: Two times a day (BID) | ORAL | 0 refills | Status: DC

## 2023-09-05 MED ORDER — ALBUTEROL SULFATE HFA 108 (90 BASE) MCG/ACT IN AERS: 2.0000 | INHALATION_SPRAY | Freq: Four times a day (QID) | RESPIRATORY_TRACT | 3 refills | Status: AC | PRN

## 2023-09-05 MED ORDER — ALBUTEROL SULFATE (2.5 MG/3ML) 0.083% IN NEBU: 2.5000 mg | INHALATION_SOLUTION | Freq: Four times a day (QID) | RESPIRATORY_TRACT | 5 refills | Status: AC | PRN

## 2023-09-05 MED ORDER — PREDNISONE 10 MG PO TABS: ORAL_TABLET | ORAL | 0 refills | Status: DC

## 2023-09-05 NOTE — Progress Notes (Unsigned)
 @Patient  ID: Jennifer Evans, female    DOB: 1961-01-09, 63 y.o.   MRN: [id number]  Chief Complaint  Patient presents with   Acute Visit    Asthma flare up/ allergies and congestion. Started a month ago.    Referring provider: Alejandro Hurt, FNP  HPI: 63 year old female, never smoker followed for mild intermittent asthma and allergic rhinitis.  She is a former patient Dr. Glenis Langdon and last seen in office on 05/06/2021 by [facility] NP.  Recent diagnosis of thyroid  neoplasm status post total thyroidectomy with lymph node dissection in 04/2021.  Past medical history significant for GERD and eczema.  TEST/EVENTS:  11/29/2017 PFTs: FVC 3.34 (101), FEV1 2.5 (97), ratio 79, TLC 95%, DLCO 108% 02/24/2019 echocardiogram: EF 60-65%. Trace MVR. Trivial tricuspid regurg.   11/08/2018: Margarito Shearing with Sueanne Emerald NP for acute allergy  symptoms and cough.  Given prednisone  burst by PCP with improvement in symptoms.  Advised to resume Symbicort  2 puffs twice daily, Flonase and omeprazole.  CXR clear.  Supportive care.  05/06/2021: Ov with Jauna Raczynski NP for persistent cough and hoarseness. Her hoarseness started 6 weeks ago after she underwent total thyroidectomy for thyroid  neoplasm. She was cleared by the surgeon and not found to have any complications from a surgical standpoint; however, she has experienced minimal improvement of the hoarseness. She also has had a persistent, primarily dry hacking cough for the past month. She does produce sputum which is clear to white in the mornings. She has noticed an occasional wheeze but is unsure if it is coming from her lungs or upper airway. She denies shortness of breath, difficulties swallowing, orthopnea, PND, or chest pain. She has not had any increased swelling where her incision site is. She was recently treated with a prednisone  taper by her PCP for suspected asthma flare. She continues on Symbicort  160 2 puffs Twice daily and occasionally uses her rescue albuterol , but hasn't noticed a  huge relief in her symptoms with it. She continues on singulair  nightly.  FeNO 41 ppb  09/05/2023: Today - follow up Discussed the use of AI scribe software for clinical note transcription with the patient, who gave verbal consent to proceed.  History of Present Illness   Jennifer Evans is a 63 year old female with allergic asthma who presents with laryngitis and respiratory symptoms.  She has been experiencing laryngitis and respiratory symptoms for the past month. Her voice is raspy, though it has improved over the last few days. Initially, she developed a sore throat with significant mucus production that was difficult to clear, described as 'green and gross'.  A month ago, she was prescribed amoxicillin, which provided some relief, but symptoms have persisted or recurred. She feels like there is 'a bunch of gross junk' in her throat and is concerned about her lungs and bronchial health. No fever, chills, or hemoptysis have been noted.  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 She has not had laryngitis since the surgery, which has caused her concern. She also has a history of allergic asthma diagnosed approximately six years ago, with flare-ups occurring a couple of times a year.  Her current medications include Symbicort , which she uses seasonally, typically for two sessions of six weeks each year, and Singulair  and Zyrtec, which she has been taking consistently for about five years. She also uses Flonase and occasionally Afrin for nasal symptoms. She is concerned about the effectiveness of the  generic Symbicort  compared to the brand name.  She recalls an episode last summer where she experienced an asthma attack, described as 'breathing through a straw', which resolved with the use of her inhaler. She is concerned about the potential for future episodes and the long-term management of her asthma.  She has  a family history of dementia, as her father had the condition, which raises her concern about long-term medication use and its effects on cognitive health.       Allergies  Allergen Reactions   Ketamine Other (See Comments)    Hallucinations, heart stress     Immunization History  Administered Date(s) Administered   Influenza,inj,Quad PF,6+ Mos 01/18/2017, 02/07/2019   Influenza-Unspecified 02/08/2021   Janssen (J&J) SARS-COV-2 Vaccination 11/21/2019   Tdap 12/04/2013   Zoster, Live 12/26/2019, 07/29/2020    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     b complex vitamins capsule Take 1 capsule by mouth daily.     budesonide -formoterol  (SYMBICORT ) 160-4.5 MCG/ACT inhaler Inhale 2 puffs into the lungs 2 (two) times daily. (Patient taking differently: Inhale 2 puffs into the lungs 2 (two) times daily as needed (shortness of breath).) 1 Inhaler 0   cetirizine (ZYRTEC) 10 MG tablet Take 10 mg by mouth daily.     Cholecalciferol (VITAMIN D3 GUMMIES PO) Take by mouth.     esomeprazole (NEXIUM) 20 MG capsule Take 20 mg by mouth daily as needed (acid reflux).     fluticasone (FLONASE) 50 MCG/ACT nasal spray Place 2 sprays into both nostrils 2 (two) times  daily as needed for allergies.     ibuprofen  (ADVIL ) 200 MG tablet Take 400-600 mg by mouth every 6 (six) hours as needed for headache or moderate pain.     levothyroxine  (SYNTHROID ) 88 MCG tablet Take 1 tablet (88 mcg total) by mouth daily before breakfast. (Patient taking differently: Take 75 mcg by mouth daily before breakfast. Taking 6 days a week.) 30 tablet 3   losartan  (COZAAR ) 25 MG tablet Take 25 mg by mouth daily.     montelukast  (SINGULAIR ) 10 MG tablet Take 10 mg by mouth daily.     oxymetazoline (AFRIN) 0.05 % nasal spray Place 1 spray into both nostrils 2 (two) times daily as needed for congestion.     Spacer/Aero-Holding Chambers (AEROCHAMBER MINI CHAMBER) DEVI 1 Device by Does not apply route as directed. 1 Device 0   valACYclovir (VALTREX) 1000 MG tablet Take 1,000 mg by mouth daily as needed (fever blisters).     acetaminophen  (TYLENOL ) 325 MG tablet Take 2 tablets (650 mg total) by mouth  every 6 (six) hours as needed for mild pain (or Fever >/= 101).     oxyCODONE -acetaminophen  (PERCOCET/ROXICET) 5-325 MG tablet Take 1 tablet by mouth every 4 (four) hours as needed for severe pain.     No facility-administered medications prior to visit.     Review of Systems:   Constitutional: No weight loss or gain, night sweats, fevers, chills, fatigue, or lassitude. HEENT: No headaches, difficulty swallowing, tooth/dental problems, or sore throat. No sneezing, itching, ear ache, nasal congestion, or post nasal drip +hoarseness CV:  No chest pain, orthopnea, PND, swelling in lower extremities, anasarca, dizziness, palpitations, syncope Resp: +primarily non-productive cough with sputum production in AM (clear/white); occasional wheeze. No shortness of breath with exertion or at rest. No excess mucus or change in color of mucus.  No hemoptysis.  No chest wall deformity GI:  No heartburn, indigestion, abdominal pain, nausea, vomiting, diarrhea, change in bowel habits, loss of appetite, bloody  stools.  GU: No dysuria, change in color of urine, urgency or frequency.  No flank pain, no hematuria  Skin: No rash, lesions, ulcerations MSK:  No joint pain or swelling.  No decreased range of motion.  No back pain. Neuro: No dizziness or lightheadedness.  Psych: No depression or anxiety. Mood stable.     Physical Exam:  BP 134/82 (BP Location: Right Arm, Patient Position: Sitting, Cuff Size: Normal)   Pulse 79   Ht 5\' 3"  (1.6 m)   Wt 136 lb (61.7 kg)   SpO2 95%   BMI 24.09 kg/m   GEN: Pleasant, interactive, well-nourished; in no acute distress. HEENT:  Normocephalic and atraumatic. EACs patent bilaterally. TM pearly gray with present light reflex bilaterally. PERRLA. Sclera white. Nasal turbinates pink, moist and patent bilaterally. No rhinorrhea present. Oropharynx pink and moist, without exudate or edema. No lesions, ulcerations, or postnasal drip. Hoarse voice NECK:  Supple w/ fair ROM. No JVD present. Normal carotid impulses w/o bruits. Thyroid  symmetrical with no goiter or nodules palpated. No lymphadenopathy.   CV: RRR, no m/r/g, no peripheral edema. Pulses intact, +2 bilaterally. No cyanosis, pallor or clubbing. PULMONARY:  Unlabored, regular breathing. Clear bilaterally A&P w/o wheezes/rales/rhonchi. No accessory muscle use. No dullness to percussion. GI: BS present and normoactive. Soft, non-tender to palpation. No organomegaly or masses detected. No CVA tenderness. MSK: No erythema, warmth or tenderness. Cap refil <2 sec all extrem. No deformities or joint swelling noted.  Neuro: A/Ox3. No focal deficits noted.   Skin: Warm, no lesions or rashe Psych: Normal affect and behavior. Judgement and thought content appropriate.     Lab Results:  CBC    Component Value Date/Time   WBC 5.7 03/23/2021 1330   RBC 4.46 03/23/2021 1330   HGB 14.0 03/23/2021 1330   HGB 12.9 10/15/2008 1013   HCT 42.2 03/23/2021 1330   HCT 37.8 10/15/2008 1013   PLT 452 (H) 03/23/2021 1330    PLT 328 10/15/2008 1013   MCV 94.6 03/23/2021 1330   MCV 94.2 10/15/2008 1013   MCH 31.4 03/23/2021 1330   MCHC 33.2 03/23/2021 1330   RDW 13.3 03/23/2021 1330   RDW 14.0 10/15/2008 1013   LYMPHSABS 1.1 11/23/2017 1257   LYMPHSABS 0.9 10/15/2008 1013   MONOABS 0.7 11/23/2017 1257   MONOABS 0.4 10/15/2008 1013   EOSABS 0.2 11/23/2017 1257   EOSABS 0.2 10/15/2008 1013   BASOSABS 0.0 11/23/2017 1257   BASOSABS 0.0 10/15/2008 1013    BMET    Component Value Date/Time   NA 134 (  L) 03/26/2021 0149   K 4.5 03/26/2021 0149   CL 104 03/26/2021 0149   CO2 22 03/26/2021 0149   GLUCOSE 133 (H) 03/26/2021 0149   BUN 18 03/26/2021 0149   CREATININE 0.81 03/26/2021 0149   CALCIUM  8.9 03/26/2021 0149   GFRNONAA >60 03/26/2021 0149   GFRAA  04/28/2010 1119    >60        The eGFR has been calculated using the MDRD equation. This calculation has not been validated in all clinical situations. eGFR's persistently <60 mL/min signify possible Chronic Kidney Disease.    BNP No results found for: "BNP"   Imaging:  05/06/2021: CXR 2 view reviewed by me with clear lungs and no acute process.  No results found.  Administration History     None          Latest Ref Rng & Units 11/29/2017   11:52 AM  PFT Results  FVC-Pre L 3.34   FVC-Predicted Pre % 101   FVC-Post L 3.11   FVC-Predicted Post % 94   Pre FEV1/FVC % % 75   Post FEV1/FCV % % 79   FEV1-Pre L 2.50   FEV1-Predicted Pre % 97   FEV1-Post L 2.46   DLCO uncorrected ml/min/mmHg 25.74   DLCO UNC% % 109   DLCO corrected ml/min/mmHg 25.50   DLCO COR %Predicted % 108   DLVA Predicted % 119   TLC L 4.73   TLC % Predicted % 95   RV % Predicted % 89     No results found for: "NITRICOXIDE"      Assessment & Plan:   No problem-specific Assessment & Plan notes found for this encounter.    Roetta Clarke, NP 09/05/2023  Pt aware and understands NP's role.

## 2023-09-05 NOTE — Patient Instructions (Addendum)
 Continue Albuterol  inhaler 2 puffs or 3 mL neb every 6 hours as needed for shortness of breath or wheezing. Notify if symptoms persist despite rescue inhaler/neb use.  Use Symbicort  (generic) 2 puffs Twice daily. Brush tongue and rinse mouth afterwards Continue zyrtec 1 tab daily  Use saline nasal rinses 1-2 times a day with distilled water  and then follow with flonase nasal spray 2 sprays each nostril daily 30 minutes after the rinses  Prednisone  taper. 4 tabs for 2 days, then 3 tabs for 2 days, 2 tabs for 2 days, then 1 tab for 2 days, then stop. Take in AM Doxycycline 1 tab Twice daily for 7 days. Take with food. Wear sunscreen when outside  Use mucinex DM 600 mg Twice daily for cough/congestion over the counter   Chest x ray and labs today   Try to decrease use of afrin. You don't want to use this daily  Follow up in 4-6 weeks with new pulmonologist or Jennifer Janvi Ammar,NP. If symptoms do not improve or worsen, please contact office for sooner follow up or seek emergency care.

## 2023-09-06 LAB — CBC WITH DIFFERENTIAL/PLATELET
Basophils Absolute: 0 10*3/uL (ref 0.0–0.1)
Basophils Relative: 0.6 % (ref 0.0–3.0)
Eosinophils Absolute: 0.1 10*3/uL (ref 0.0–0.7)
Eosinophils Relative: 2.6 % (ref 0.0–5.0)
HCT: 39.5 % (ref 36.0–46.0)
Hemoglobin: 13.1 g/dL (ref 12.0–15.0)
Lymphocytes Relative: 17.8 % (ref 12.0–46.0)
Lymphs Abs: 1 10*3/uL (ref 0.7–4.0)
MCHC: 33.2 g/dL (ref 30.0–36.0)
MCV: 92.1 fl (ref 78.0–100.0)
Monocytes Absolute: 0.5 10*3/uL (ref 0.1–1.0)
Monocytes Relative: 9.3 % (ref 3.0–12.0)
Neutro Abs: 3.8 10*3/uL (ref 1.4–7.7)
Neutrophils Relative %: 69.7 % (ref 43.0–77.0)
Platelets: 395 10*3/uL (ref 150.0–400.0)
RBC: 4.29 Mil/uL (ref 3.87–5.11)
RDW: 14.1 % (ref 11.5–15.5)
WBC: 5.4 10*3/uL (ref 4.0–10.5)

## 2023-09-07 ENCOUNTER — Ambulatory Visit: Payer: Self-pay | Admitting: Nurse Practitioner

## 2023-09-07 ENCOUNTER — Encounter: Payer: Self-pay | Admitting: Nurse Practitioner

## 2023-09-07 DIAGNOSIS — J069 Acute upper respiratory infection, unspecified: Secondary | ICD-10-CM | POA: Insufficient documentation

## 2023-09-07 LAB — IGE: IgE (Immunoglobulin E), Serum: 98 kU/L (ref <=114)

## 2023-09-07 NOTE — Assessment & Plan Note (Addendum)
 See above. Add on intranasal steroid. Limit further upper airway irritation and initiate voice rest. Advised to taper off afrin use due to risk of rebound rhinitis with continued use. See above plan.

## 2023-09-07 NOTE — Assessment & Plan Note (Signed)
 Acute asthmatic bronchitis, likely exacerbated by viral illness. Some improvement with prior amoxicillin course but symptoms recurred. Elevated exhaled nitric oxide  testing, consistent with type II inflammation. Will treat her with prednisone  taper and empiric doxycycline  course. CXR today to rule out superimposed infection. Advised to increase Symbicort  usage to twice daily dosing for minimum of 3 months. Reassess at follow up. Supportive care measures. Refilled albuterol  HFA and nebulizer. Check IgE and CBC with diff to assess peripheral eosinophils/allergic component. Possible a component of her cough/sputum production is sinus related. Action plan in place.  Patient Instructions  Continue Albuterol  inhaler 2 puffs or 3 mL neb every 6 hours as needed for shortness of breath or wheezing. Notify if symptoms persist despite rescue inhaler/neb use.  Use Symbicort  (generic) 2 puffs Twice daily. Brush tongue and rinse mouth afterwards Continue zyrtec 1 tab daily  Use saline nasal rinses 1-2 times a day with distilled water  and then follow with flonase nasal spray 2 sprays each nostril daily 30 minutes after the rinses  Prednisone  taper. 4 tabs for 2 days, then 3 tabs for 2 days, 2 tabs for 2 days, then 1 tab for 2 days, then stop. Take in AM Doxycycline  1 tab Twice daily for 7 days. Take with food. Wear sunscreen when outside  Use mucinex DM 600 mg Twice daily for cough/congestion over the counter   Chest x ray and labs today   Try to decrease use of afrin. You don't want to use this daily  Follow up in 4-6 weeks with new pulmonologist or Jennifer Ilhan Debenedetto,NP. If symptoms do not improve or worsen, please contact office for sooner follow up or seek emergency care.

## 2023-09-11 NOTE — Progress Notes (Signed)
 IgE and blood counts normal. Continue our plan as discussed. Thanks.

## 2023-09-14 NOTE — Progress Notes (Signed)
 I would recommend continuing it through spring allergy  season and then can stop during summer and reassess. Would resume in fall.

## 2023-09-26 DIAGNOSIS — M25572 Pain in left ankle and joints of left foot: Secondary | ICD-10-CM | POA: Diagnosis not present

## 2023-09-28 DIAGNOSIS — M79672 Pain in left foot: Secondary | ICD-10-CM | POA: Diagnosis not present

## 2023-10-02 DIAGNOSIS — L814 Other melanin hyperpigmentation: Secondary | ICD-10-CM | POA: Diagnosis not present

## 2023-10-02 DIAGNOSIS — R609 Edema, unspecified: Secondary | ICD-10-CM | POA: Diagnosis not present

## 2023-10-25 DIAGNOSIS — M9906 Segmental and somatic dysfunction of lower extremity: Secondary | ICD-10-CM | POA: Diagnosis not present

## 2023-10-25 DIAGNOSIS — M9901 Segmental and somatic dysfunction of cervical region: Secondary | ICD-10-CM | POA: Diagnosis not present

## 2023-10-25 DIAGNOSIS — M9903 Segmental and somatic dysfunction of lumbar region: Secondary | ICD-10-CM | POA: Diagnosis not present

## 2023-10-25 DIAGNOSIS — M9902 Segmental and somatic dysfunction of thoracic region: Secondary | ICD-10-CM | POA: Diagnosis not present

## 2023-10-26 ENCOUNTER — Other Ambulatory Visit: Payer: Self-pay

## 2023-10-26 ENCOUNTER — Encounter (HOSPITAL_BASED_OUTPATIENT_CLINIC_OR_DEPARTMENT_OTHER): Payer: Self-pay

## 2023-10-26 ENCOUNTER — Emergency Department (HOSPITAL_BASED_OUTPATIENT_CLINIC_OR_DEPARTMENT_OTHER)

## 2023-10-26 ENCOUNTER — Observation Stay (HOSPITAL_BASED_OUTPATIENT_CLINIC_OR_DEPARTMENT_OTHER)
Admission: EM | Admit: 2023-10-26 | Discharge: 2023-10-28 | Disposition: A | Discharge status: Home / Self Care | Attending: Internal Medicine | Admitting: Internal Medicine

## 2023-10-26 DIAGNOSIS — J4520 Mild intermittent asthma, uncomplicated: Secondary | ICD-10-CM | POA: Diagnosis not present

## 2023-10-26 DIAGNOSIS — F1090 Alcohol use, unspecified, uncomplicated: Secondary | ICD-10-CM | POA: Diagnosis not present

## 2023-10-26 DIAGNOSIS — Z9889 Other specified postprocedural states: Secondary | ICD-10-CM

## 2023-10-26 DIAGNOSIS — I10 Essential (primary) hypertension: Secondary | ICD-10-CM | POA: Diagnosis not present

## 2023-10-26 DIAGNOSIS — R6510 Systemic inflammatory response syndrome (SIRS) of non-infectious origin without acute organ dysfunction: Secondary | ICD-10-CM | POA: Diagnosis not present

## 2023-10-26 DIAGNOSIS — Z9009 Acquired absence of other part of head and neck: Secondary | ICD-10-CM | POA: Diagnosis not present

## 2023-10-26 DIAGNOSIS — K5732 Diverticulitis of large intestine without perforation or abscess without bleeding: Principal | ICD-10-CM | POA: Diagnosis present

## 2023-10-26 DIAGNOSIS — Z79899 Other long term (current) drug therapy: Secondary | ICD-10-CM | POA: Diagnosis not present

## 2023-10-26 DIAGNOSIS — K5730 Diverticulosis of large intestine without perforation or abscess without bleeding: Secondary | ICD-10-CM | POA: Diagnosis not present

## 2023-10-26 DIAGNOSIS — R109 Unspecified abdominal pain: Secondary | ICD-10-CM | POA: Diagnosis not present

## 2023-10-26 DIAGNOSIS — K5792 Diverticulitis of intestine, part unspecified, without perforation or abscess without bleeding: Principal | ICD-10-CM

## 2023-10-26 DIAGNOSIS — S31030A Puncture wound without foreign body of lower back and pelvis without penetration into retroperitoneum, initial encounter: Secondary | ICD-10-CM | POA: Diagnosis not present

## 2023-10-26 LAB — COMPREHENSIVE METABOLIC PANEL WITH GFR
ALT: 36 U/L (ref 0–44)
AST: 45 U/L — ABNORMAL HIGH (ref 15–41)
Albumin: 4.2 g/dL (ref 3.5–5.0)
Alkaline Phosphatase: 139 U/L — ABNORMAL HIGH (ref 38–126)
Anion gap: 11 (ref 5–15)
BUN: 13 mg/dL (ref 8–23)
CO2: 23 mmol/L (ref 22–32)
Calcium: 9.5 mg/dL (ref 8.9–10.3)
Chloride: 100 mmol/L (ref 98–111)
Creatinine, Ser: 0.78 mg/dL (ref 0.44–1.00)
GFR, Estimated: >60 mL/min (ref >60)
Glucose, Bld: 113 mg/dL — ABNORMAL HIGH (ref 70–99)
Potassium: 4.2 mmol/L (ref 3.5–5.1)
Sodium: 135 mmol/L (ref 135–145)
Total Bilirubin: 0.7 mg/dL (ref 0.0–1.2)
Total Protein: 6.9 g/dL (ref 6.5–8.1)

## 2023-10-26 LAB — CBC
HCT: 40.2 % (ref 36.0–46.0)
Hemoglobin: 13.3 g/dL (ref 12.0–15.0)
MCH: 30.4 pg (ref 26.0–34.0)
MCHC: 33.1 g/dL (ref 30.0–36.0)
MCV: 92 fL (ref 80.0–100.0)
Platelets: 371 K/uL (ref 150–400)
RBC: 4.37 MIL/uL (ref 3.87–5.11)
RDW: 14 % (ref 11.5–15.5)
WBC: 10.9 K/uL — ABNORMAL HIGH (ref 4.0–10.5)
nRBC: 0 % (ref 0.0–0.2)

## 2023-10-26 LAB — URINALYSIS, ROUTINE W REFLEX MICROSCOPIC
Glucose, UA: NEGATIVE mg/dL
Ketones, ur: NEGATIVE mg/dL
Nitrite: NEGATIVE
Protein, ur: 30 mg/dL — AB
Specific Gravity, Urine: 1.02 (ref 1.005–1.030)
pH: 7 (ref 5.0–8.0)

## 2023-10-26 LAB — URINALYSIS, MICROSCOPIC (REFLEX)

## 2023-10-26 LAB — LACTIC ACID, PLASMA: Lactic Acid, Venous: 0.6 mmol/L (ref 0.5–1.9)

## 2023-10-26 LAB — LIPASE, BLOOD: Lipase: 24 U/L (ref 11–51)

## 2023-10-26 MED ORDER — SODIUM CHLORIDE 0.9 % IV BOLUS
1000.0000 mL | Freq: Once | INTRAVENOUS | Status: AC
  Administered 2023-10-26: 1000 mL via INTRAVENOUS

## 2023-10-26 MED ORDER — IOHEXOL 300 MG/ML  SOLN
100.0000 mL | Freq: Once | INTRAMUSCULAR | Status: AC | PRN
  Administered 2023-10-26: 100 mL via INTRAVENOUS

## 2023-10-26 MED ORDER — PIPERACILLIN-TAZOBACTAM 3.375 G IVPB
3.3750 g | Freq: Three times a day (TID) | INTRAVENOUS | Status: DC
  Administered 2023-10-27: 3.375 g via INTRAVENOUS
  Filled 2023-10-26: qty 50

## 2023-10-26 MED ORDER — ACETAMINOPHEN 500 MG PO TABS
1000.0000 mg | ORAL_TABLET | Freq: Once | ORAL | Status: AC
  Administered 2023-10-26: 1000 mg via ORAL
  Filled 2023-10-26: qty 2

## 2023-10-26 MED ORDER — ONDANSETRON HCL 4 MG/2ML IJ SOLN
4.0000 mg | Freq: Once | INTRAMUSCULAR | Status: AC
  Administered 2023-10-26: 4 mg via INTRAVENOUS
  Filled 2023-10-26: qty 2

## 2023-10-26 MED ORDER — METOCLOPRAMIDE HCL 5 MG/ML IJ SOLN
10.0000 mg | Freq: Once | INTRAMUSCULAR | Status: AC
  Administered 2023-10-26: 10 mg via INTRAVENOUS
  Filled 2023-10-26: qty 2

## 2023-10-26 MED ORDER — PIPERACILLIN-TAZOBACTAM 3.375 G IVPB 30 MIN
3.3750 g | Freq: Once | INTRAVENOUS | Status: AC
  Administered 2023-10-26: 3.375 g via INTRAVENOUS
  Filled 2023-10-26: qty 50

## 2023-10-26 MED ORDER — DIPHENHYDRAMINE HCL 50 MG/ML IJ SOLN
25.0000 mg | Freq: Once | INTRAMUSCULAR | Status: AC
  Administered 2023-10-26: 25 mg via INTRAVENOUS
  Filled 2023-10-26: qty 1

## 2023-10-26 NOTE — ED Notes (Signed)
 Pt. Reports she has been in the bed all day today due to not feeling well.  Pt. Reports she thought she might have a kidney stone due to the abd  pain.

## 2023-10-26 NOTE — ED Triage Notes (Signed)
 Pt presents with gradual onset of lower abd pain that started last night. Pt reports she has associated loose stool. Today pt reports she developed a fever TMax 103 with a HA. Denies sick contacts or eating any questionable food.

## 2023-10-26 NOTE — ED Provider Notes (Signed)
 [place] Provider Note   CSN: [id number] Arrival date & time: 10/26/23  1755     Patient presents with: Abdominal Pain   Jennifer Evans is a 63 y.o. female.    Abdominal Pain   63 year old female presents emergency department with lower abdominal pain, loose bowel movements, fever.  Patient reports abdominal pain beginning sometime last night mainly left lower abdomen but does radiate to her middle abdomen.  States that she had chills earlier today and husband took her temperature and it was elevated temp orally to 103.  Reports feelings of nausea with no emesis.  Denies any urinary symptoms, hematochezia/melena.  Denies any recent antibiotic use, international travel, hiking/camping, suspicious food consumption.  Denies history of similar symptoms in the past.  Presents emergency department for further assessment/evaluation.  Past medical history significant for hypertension, kidney stone, eczema, anxiety, arthritis, thyroid  nodule  Prior to Admission medications   Medication Sig Start Date End Date Taking? Authorizing Provider  acetaminophen  (TYLENOL ) 325 MG tablet Take 2 tablets (650 mg total) by mouth every 6 (six) hours as needed for mild pain (or Fever >/= 101). 03/26/21   Vicci Burnard SAUNDERS, PA-C  albuterol  (PROVENTIL ) (2.5 MG/3ML) 0.083% nebulizer solution Take 3 mLs (2.5 mg total) by nebulization every 6 (six) hours as needed for wheezing or shortness of breath. 09/05/23   Cobb, Comer GAILS, NP  albuterol  (VENTOLIN  HFA) 108 (90 Base) MCG/ACT inhaler Inhale 2 puffs into the lungs every 6 (six) hours as needed for wheezing or shortness of breath. 09/05/23   Cobb, Comer GAILS, NP  b complex vitamins capsule Take 1 capsule by mouth daily.    [provider]  budesonide -formoterol  (SYMBICORT ) 160-4.5 MCG/ACT inhaler Inhale 2 puffs into the lungs 2 (two) times daily. Patient taking differently: Inhale 2 puffs into the lungs 2 (two)  times daily as needed (shortness of breath). 12/05/17   Icard, Adine CROME, DO  cetirizine (ZYRTEC) 10 MG tablet Take 10 mg by mouth daily.    [provider]  Cholecalciferol (VITAMIN D3 GUMMIES PO) Take by mouth.    [provider]  doxycycline  (VIBRA -TABS) 100 MG tablet Take 1 tablet (100 mg total) by mouth 2 (two) times daily. 09/05/23   Cobb, Comer GAILS, NP  esomeprazole (NEXIUM) 20 MG capsule Take 20 mg by mouth daily as needed (acid reflux).    [provider]  fluticasone (FLONASE) 50 MCG/ACT nasal spray Place 2 sprays into both nostrils 2 (two) times daily as needed for allergies. 11/07/18   [provider]  ibuprofen  (ADVIL ) 200 MG tablet Take 400-600 mg by mouth every 6 (six) hours as needed for headache or moderate pain.    [provider]  levothyroxine  (SYNTHROID ) 88 MCG tablet Take 1 tablet (88 mcg total) by mouth daily before breakfast. Patient taking differently: Take 75 mcg by mouth daily before breakfast. Taking 6 days a week. 03/25/21   Eletha Boas, MD  losartan  (COZAAR ) 25 MG tablet Take 25 mg by mouth daily.    [provider]  montelukast  (SINGULAIR ) 10 MG tablet Take 10 mg by mouth daily. 11/01/18   [provider]  oxyCODONE -acetaminophen  (PERCOCET/ROXICET) 5-325 MG tablet Take 1 tablet by mouth every 4 (four) hours as needed for severe pain.    [provider]  oxymetazoline (AFRIN) 0.05 % nasal spray Place 1 spray into both nostrils 2 (two) times daily as needed for congestion.    [provider]  predniSONE  (  DELTASONE ) 10 MG tablet 4 tabs for 2 days, then 3 tabs for 2 days, 2 tabs for 2 days, then 1 tab for 2 days, then stop 09/05/23   Cobb, Comer GAILS, NP  Spacer/Aero-Holding Chambers (AEROCHAMBER MINI CHAMBER) DEVI 1 Device by Does not apply route as directed. 11/15/17   Icard, Adine CROME, DO  valACYclovir  (VALTREX ) 1000 MG tablet Take 1,000 mg by mouth daily as needed (fever blisters).    [provider]    Allergies: Ketamine    Review of Systems  Gastrointestinal:  Positive for abdominal pain.  All other systems reviewed and are negative.   Updated Vital Signs BP (!) 140/74 (BP Location: Left Arm)   Pulse (!) 106   Temp (!) 101.3 F (38.5 C)   Resp 18   Ht 5' 3 (1.6 m)   Wt 59.9 kg   SpO2 97%   BMI 23.38 kg/m   Physical Exam Vitals and nursing note reviewed.  Constitutional:      General: She is not in acute distress.    Appearance: She is well-developed.  HENT:     Head: Normocephalic and atraumatic.  Eyes:     Conjunctiva/sclera: Conjunctivae normal.  Cardiovascular:     Rate and Rhythm: Normal rate and regular rhythm.  Pulmonary:     Effort: Pulmonary effort is normal. No respiratory distress.     Breath sounds: Normal breath sounds.  Abdominal:     Palpations: Abdomen is soft.     Tenderness: There is abdominal tenderness in the suprapubic area and left lower quadrant. There is no right CVA tenderness or left CVA tenderness.  Musculoskeletal:        General: No swelling.     Cervical back: Neck supple.  Skin:    General: Skin is warm and dry.     Capillary Refill: Capillary refill takes less than 2 seconds.  Neurological:     Mental Status: She is alert.  Psychiatric:        Mood and Affect: Mood normal.     (all labs ordered are listed, but only abnormal results are displayed) Labs Reviewed  COMPREHENSIVE METABOLIC PANEL WITH GFR - Abnormal; Notable for the following components:      Result Value   Glucose, Bld 113 (*)    AST 45 (*)    Alkaline Phosphatase 139 (*)    All other components within normal limits  CBC - Abnormal; Notable for the following components:   WBC 10.9 (*)    All other components within normal limits  URINALYSIS, ROUTINE W REFLEX MICROSCOPIC - Abnormal; Notable for the following components:   Color, Urine AMBER (*)    Hgb urine dipstick TRACE (*)    Bilirubin Urine SMALL (*)    Protein, ur 30 (*)     Leukocytes,Ua TRACE (*)    All other components within normal limits  URINALYSIS, MICROSCOPIC (REFLEX) - Abnormal; Notable for the following components:   Bacteria, UA RARE (*)    All other components within normal limits  CULTURE, BLOOD (ROUTINE X 2)  CULTURE, BLOOD (ROUTINE X 2)  LIPASE, BLOOD  LACTIC ACID, PLASMA  LACTIC ACID, PLASMA    EKG: None  Radiology: No results found.   .Critical Care  Performed by: Silver Wonda LABOR, PA Authorized by: Silver Wonda LABOR, PA   Critical care provider statement:    Critical care time (minutes):  48   Critical care was necessary to treat or prevent imminent or life-threatening deterioration of  the following conditions:  Sepsis   Critical care was time spent personally by me on the following activities:  Development of treatment plan with patient or surrogate, discussions with consultants, evaluation of patient's response to treatment, examination of patient, ordering and review of laboratory studies, ordering and review of radiographic studies, ordering and performing treatments and interventions, pulse oximetry, re-evaluation of patient's condition and review of old charts   I assumed direction of critical care for this patient from another provider in my specialty: no     Care discussed with: admitting provider      Medications Ordered in the ED  sodium chloride  0.9 % bolus 1,000 mL (has no administration in time range)  acetaminophen  (TYLENOL ) tablet 1,000 mg (has no administration in time range)  ondansetron  (ZOFRAN ) injection 4 mg (has no administration in time range)  piperacillin -tazobactam (ZOSYN ) IVPB 3.375 g (has no administration in time range)    Followed by  piperacillin -tazobactam (ZOSYN ) IVPB 3.375 g (has no administration in time range)    Clinical Course as of 10/26/23 2243  Fri Oct 26, 2023  2202 Consulted Dr. Tanda of general surgery who recommended admission through hospitalist to either hospital.  Will see in  consultation. [CR]  2221 Consulted hospitalist regarding the patient who agreed with admission and assume further treatment/care. [CR]    Clinical Course User Index [CR] Silver Wonda LABOR, PA                                 Medical Decision Making Amount and/or Complexity of Data Reviewed Labs: ordered. Radiology: ordered.  Risk OTC drugs. Prescription drug management. Decision regarding hospitalization.   This patient presents to the ED for concern of abdominal pain, this involves an extensive number of treatment options, and is a complaint that carries with it a high risk of complications and morbidity.  The differential diagnosis includes gastritis, PUD, cholecystitis, CBD pathology, SBO/LBO, volvulus, viral gastroenteritis, diverticulitis, appendicitis, other   Co morbidities that complicate the patient evaluation  See HPI   Additional history obtained:  Additional history obtained from EMR External records from outside source obtained and reviewed including hospital records   Lab Tests:  I Ordered, and personally interpreted labs.  The pertinent results include: Leukocytosis of 10.9.  No evidence of anemia.  Placed within range.  No electrolyte abnormalities.  Mild elevation in AST of 45.  Elevated alkaline phosphatase of 139.  No renal dysfunction.  UA with 3 bacteria, trace leukocytes, 30 proteins, trace hemoglobin, small bilirubin otherwise unremarkable.  Lipase within normal limits.  Lactic acid 0.6.  Blood cultures pending.   Imaging Studies ordered:  I ordered imaging studies including the abdomen and pelvis I independently visualized and interpreted imaging which showed focal wall thickening and inflammation involving rectosigmoid colon acute diverticulitis or possible colitis.  Small bowel gas posterior pelvic fat small diverticulum but cannot entirely exclude small focus of contained perforation.  Fluid collection within the colon.  Aortic atherosclerosis. I  agree with the radiologist interpretation  Cardiac Monitoring: / EKG:  The patient was maintained on a cardiac monitor.  I personally viewed and interpreted the cardiac monitored which showed an underlying rhythm of: Tachycardia   Consultations Obtained:  See ED course  Problem List / ED Course / Critical interventions / Medication management  Diverticulitis, SIRS I ordered medication including Reglan , Benadryl , normal saline, Zosyn , Tylenol , Zofran    Reevaluation of the patient after these medicines  showed that the patient improved I have reviewed the patients home medicines and have made adjustments as needed   Social Determinants of Health:  Denies tobacco, illicit drug use.   Test / Admission - Considered:  Diverticulitis, SIRS Vitals signs significant for fever, tachycardia. Otherwise within normal range and stable throughout visit. Laboratory/imaging studies significant for: See above  63 year old female presents emergency department with lower abdominal pain, loose bowel movements, fever.  Patient reports abdominal pain beginning sometime last night mainly left lower abdomen but does radiate to her middle abdomen.  States that she had chills earlier today and husband took her temperature and it was elevated temp orally to 103.  Reports feelings of nausea with no emesis.  Denies any urinary symptoms, hematochezia/melena.  Denies any recent antibiotic use, international travel, hiking/camping, suspicious food consumption.  Denies history of similar symptoms in the past.  Presents emergency department for further assessment/evaluation. On exam, focal tenderness left lower quadrant with some tenderness suprapubic region.  Given initial fever, tachycardia, sepsis workup was begun including IV antibiotics in the form of Zosyn  for intra-abdominal coverage.  Laboratory studies concerning for slight leukocytosis of 10.9 with normal lactic acid.  CT imaging concerning for diverticulitis  with possible perforation versus colitis.  Consulted general surgery regarding the patient who recommended admission to hospital service will see in consult.  Consulted hospitalist agreed with admission.  Treatment plan discussed with patient and she acknowledged understanding was agreeable to said plan.  Patient stable upon admission.      Final diagnoses:  None    ED Discharge Orders     None          Silver Wonda LABOR, [place] 10/26/23 2246    Elnor Jayson LABOR, DO 11/01/23 [phone]

## 2023-10-27 DIAGNOSIS — Z9089 Acquired absence of other organs: Secondary | ICD-10-CM | POA: Diagnosis not present

## 2023-10-27 DIAGNOSIS — Z9009 Acquired absence of other part of head and neck: Secondary | ICD-10-CM | POA: Diagnosis not present

## 2023-10-27 DIAGNOSIS — F1090 Alcohol use, unspecified, uncomplicated: Secondary | ICD-10-CM | POA: Diagnosis not present

## 2023-10-27 DIAGNOSIS — K5732 Diverticulitis of large intestine without perforation or abscess without bleeding: Secondary | ICD-10-CM | POA: Diagnosis not present

## 2023-10-27 DIAGNOSIS — Z9889 Other specified postprocedural states: Secondary | ICD-10-CM

## 2023-10-27 DIAGNOSIS — J4520 Mild intermittent asthma, uncomplicated: Secondary | ICD-10-CM

## 2023-10-27 MED ORDER — MONTELUKAST SODIUM 10 MG PO TABS
10.0000 mg | ORAL_TABLET | Freq: Every day | ORAL | Status: DC
  Administered 2023-10-27 – 2023-10-28 (×2): 10 mg via ORAL
  Filled 2023-10-27 (×2): qty 1

## 2023-10-27 MED ORDER — LOSARTAN POTASSIUM 50 MG PO TABS
25.0000 mg | ORAL_TABLET | Freq: Every day | ORAL | Status: DC
  Administered 2023-10-28: 25 mg via ORAL
  Filled 2023-10-27: qty 1

## 2023-10-27 MED ORDER — LEVOTHYROXINE SODIUM 75 MCG PO TABS: 75.0000 ug | ORAL_TABLET | Freq: Every morning | ORAL | Status: DC

## 2023-10-27 MED ORDER — METRONIDAZOLE 500 MG PO TABS
500.0000 mg | ORAL_TABLET | Freq: Two times a day (BID) | ORAL | Status: DC
  Administered 2023-10-27 – 2023-10-28 (×3): 500 mg via ORAL
  Filled 2023-10-27 (×3): qty 1

## 2023-10-27 MED ORDER — ACETAMINOPHEN 325 MG PO TABS: 650.0000 mg | ORAL_TABLET | Freq: Four times a day (QID) | ORAL | Status: DC | PRN

## 2023-10-27 MED ORDER — ACETAMINOPHEN 650 MG RE SUPP: 650.0000 mg | Freq: Four times a day (QID) | RECTAL | Status: DC | PRN

## 2023-10-27 MED ORDER — HYDROMORPHONE HCL 1 MG/ML IJ SOLN: 0.5000 mg | INTRAMUSCULAR | Status: DC | PRN

## 2023-10-27 MED ORDER — PRAVASTATIN SODIUM 10 MG PO TABS
10.0000 mg | ORAL_TABLET | Freq: Every day | ORAL | Status: DC
  Administered 2023-10-27 – 2023-10-28 (×2): 10 mg via ORAL
  Filled 2023-10-27 (×2): qty 1

## 2023-10-27 MED ORDER — TRIMETHOBENZAMIDE HCL 100 MG/ML IM SOLN
200.0000 mg | Freq: Three times a day (TID) | INTRAMUSCULAR | Status: DC | PRN
  Administered 2023-10-27: 200 mg via INTRAMUSCULAR
  Filled 2023-10-27 (×2): qty 2

## 2023-10-27 MED ORDER — TRAMADOL HCL 50 MG PO TABS: 50.0000 mg | ORAL_TABLET | Freq: Four times a day (QID) | ORAL | Status: DC | PRN

## 2023-10-27 MED ORDER — SODIUM CHLORIDE 0.9 % IV SOLN
INTRAVENOUS | Status: AC | PRN
  Administered 2023-10-27: 250 mL via INTRAVENOUS

## 2023-10-27 MED ORDER — ACETAMINOPHEN 500 MG PO TABS
1000.0000 mg | ORAL_TABLET | Freq: Four times a day (QID) | ORAL | Status: DC | PRN
  Administered 2023-10-27 (×2): 1000 mg via ORAL
  Filled 2023-10-27 (×2): qty 2

## 2023-10-27 MED ORDER — ONDANSETRON HCL 4 MG/2ML IJ SOLN: 4.0000 mg | Freq: Four times a day (QID) | INTRAMUSCULAR | Status: DC | PRN

## 2023-10-27 MED ORDER — ALBUTEROL SULFATE (2.5 MG/3ML) 0.083% IN NEBU: 2.5000 mg | INHALATION_SOLUTION | RESPIRATORY_TRACT | Status: DC | PRN

## 2023-10-27 MED ORDER — TRAMADOL HCL 50 MG PO TABS
50.0000 mg | ORAL_TABLET | Freq: Four times a day (QID) | ORAL | Status: DC | PRN
  Administered 2023-10-27: 50 mg via ORAL
  Filled 2023-10-27: qty 1

## 2023-10-27 MED ORDER — ENOXAPARIN SODIUM 40 MG/0.4ML IJ SOSY
40.0000 mg | PREFILLED_SYRINGE | INTRAMUSCULAR | Status: DC
  Administered 2023-10-27 – 2023-10-28 (×2): 40 mg via SUBCUTANEOUS
  Filled 2023-10-27 (×2): qty 0.4

## 2023-10-27 MED ORDER — CIPROFLOXACIN HCL 500 MG PO TABS
500.0000 mg | ORAL_TABLET | Freq: Two times a day (BID) | ORAL | Status: DC
  Administered 2023-10-27 – 2023-10-28 (×3): 500 mg via ORAL
  Filled 2023-10-27 (×5): qty 1

## 2023-10-27 MED ORDER — SIMETHICONE 80 MG PO CHEW
80.0000 mg | CHEWABLE_TABLET | Freq: Four times a day (QID) | ORAL | Status: DC
  Administered 2023-10-27 – 2023-10-28 (×5): 80 mg via ORAL
  Filled 2023-10-27 (×5): qty 1

## 2023-10-27 MED ORDER — LORATADINE 10 MG PO TABS
10.0000 mg | ORAL_TABLET | Freq: Every day | ORAL | Status: DC
  Administered 2023-10-27 – 2023-10-28 (×2): 10 mg via ORAL
  Filled 2023-10-27 (×2): qty 1

## 2023-10-27 MED ORDER — ONDANSETRON HCL 4 MG/2ML IJ SOLN
4.0000 mg | Freq: Four times a day (QID) | INTRAMUSCULAR | Status: DC
  Administered 2023-10-27: 4 mg via INTRAVENOUS
  Filled 2023-10-27 (×2): qty 2

## 2023-10-27 NOTE — Hospital Course (Signed)
 HPI: 63 year old female history of hypertension, intermittent asthma, history of surgical hypothyroidism presents to the ER today with 2 days of crampy abdominal pain and loose stools.  She states that she went out to dinner on Thursday.  By Thursday evening, she started having some abdominal uneasiness.  She had a loose stool that evening.  She developed fever in the last 24 hours of 103.  She started having chills.  Occasional nausea but no vomiting.  She has never had a bout of diverticulitis before.  She came to the ER for evaluation.  No chest pain.  No shortness of breath.  Denies any dysuria.  Pains mostly in the left lower quadrant and suprapubic area.  Has been having loose stools since Friday night.  On arrival to the ER, temp 101.3 heart rate 106 blood pressure 140/74 satting 97% on room air.  White count 10.9, hemoglobin 13.3, platelets of 371  Lipase 24  Sodium 135, potassium 4.2, chloride 100, bicarb 23, BUN 13, creatinine 0.78, glucose 113  Total protein 6.9, BUN 4.2, AST 45, ALT 236, alk phos 139, total bili 0.7  Lactic acid normal at 0.6  UA showed trace hemoglobin.  Negative nitrite.  CT abdomen pelvis showed focal thickening and fluid inflammation around the rectosigmoid colon.  Suspected to be acute diverticulitis.  Significant Events: Admitted 10/26/2023 for acute sigmoid diverticulitis   Admission Labs: White count 10.9, hemoglobin 13.3, platelets of 371 Lipase 24 Sodium 135, potassium 4.2, chloride 100, bicarb 23, BUN 13, creatinine 0.78, glucose 113 Total protein 6.9, BUN 4.2, AST 45, ALT 236, alk phos 139, total bili 0.7 Lactic acid normal at 0.6 UA showed trace hemoglobin.  Negative nitrite.  Admission Imaging Studies: CT abdomen pelvis showed focal thickening and fluid inflammation around the rectosigmoid colon.  Suspected to be acute diverticulitis.  Significant Labs:   Significant Imaging Studies:   Antibiotic Therapy: Anti-infectives (From  admission, onward)    Start     Dose/Rate Route Frequency Ordered Stop   10/27/23 1245  ciprofloxacin  (CIPRO ) tablet 500 mg        500 mg Oral 2 times daily 10/27/23 1148     10/27/23 1245  metroNIDAZOLE  (FLAGYL ) tablet 500 mg        500 mg Oral Every 12 hours 10/27/23 1148     10/27/23 0600  piperacillin -tazobactam (ZOSYN ) IVPB 3.375 g  Status:  Discontinued       Placed in Followed by Linked Group   3.375 g 12.5 mL/hr over 240 Minutes Intravenous Every 8 hours 10/26/23 1959 10/27/23 1148   10/26/23 2000  piperacillin -tazobactam (ZOSYN ) IVPB 3.375 g       Placed in Followed by Linked Group   3.375 g 100 mL/hr over 30 Minutes Intravenous  Once 10/26/23 1959 10/26/23 2138       Procedures:   Consultants: surgery

## 2023-10-27 NOTE — Assessment & Plan Note (Signed)
 10-27-2023 continue with singulair . Prn albuterol .  10-28-2023 stable.

## 2023-10-27 NOTE — Plan of Care (Signed)

## 2023-10-27 NOTE — Assessment & Plan Note (Signed)
 10-27-2023 continue with synthroid .  10-28-2023 stable.

## 2023-10-27 NOTE — ED Notes (Signed)
 Pt doesn't wish to wear a gown.  She wants to wear her clothes from home.

## 2023-10-27 NOTE — Assessment & Plan Note (Signed)
 10-27-2023 admit to med/surg bed. Pt without peritoneal signs. No vomiting. She is able to take po meds. Still start po cipro /flagyl . Allow for clear liquids. Prn IV dilaudid  and prn po ultram . Pt wants to make sure she can tolerate liquids without nausea. Start scheduled IV zofran . She is already able to drink sweet tea without difficulty.  10-28-2023 tolerating po cipro /flagyl . I do not think she has a perforation. Pt has minimal abd pain. No leukocytosis. No fevers. Pt wants to try solid food. Will advance to soft GI diet. Prn zofran . If she is able to tolerate 2 solid meals, she can be discharged to home and f/u with PCP next week to make sure her diverticulitis has resolved.

## 2023-10-27 NOTE — Consult Note (Signed)
 Surgical Evaluation Requesting provider: Dr. Alm Heads  Chief Complaint: Abdominal pain  HPI: Very pleasant 63 year old woman with history of anxiety, arthritis, asthma, eczema, GERD, hypertension, kidney stones, hypothyroidism status post total thyroidectomy for medullary thyroid  carcinoma (Dr. Eletha 2022) and previous appendectomy who presented to med [facility] yesterday evening with about a 24-hour history of gradually worsening lower abdominal pain.  This is associated with loose stool and a fever of 103, chills and nausea.  No known sick exposure, recent antibiotic use, international travel, hiking/camping or questionable food intake.  The pain is most concentrated in the left lower quadrant but radiates to the suprapubic/infraumbilical region.  Denies any urinary symptoms, melena, hematochezia, or other GI complaints.  No prior similar episodes.  She was febrile to 101.3 on presentation yesterday evening and workup notable for unremarkable CMP, white count 10.9, normal lactate.  Blood cultures pending. CT abdomen and pelvis demonstrates focal wall thickening inflammation of the rectosigmoid region secondary to acute diverticulitis versus colitis with a single dot of gas in the mesenteric fat versus small diverticulum.  No pneumoperitoneum or abscess.  I have reviewed the images personally.  She reports her last colonoscopy was done about a year ago at [facility] and states that she had 1 polyp removed and was told she would need 5-year repeat colonoscopy.  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   Sinus headache    Thyroid  disease    hx of thyroid  nodule (stable)    Past Surgical History:  Procedure Laterality Date   APPENDECTOMY  2007   benign thyroid  nodule removed  1999   BREAST EXCISIONAL BIOPSY Right    COLONOSCOPY  12/2009   LYMPH NODE DISSECTION N/A 03/25/2021   Procedure: LIMITED LYMPH NODE DISSECTION;  Surgeon: Eletha Boas, MD;  Location: [facility] OR;  Service: General;  Laterality: N/A;   ORIF ANKLE FRACTURE Left 11/16/2021   Procedure: OPEN REDUCTION INTERNAL FIXATION (ORIF) ANKLE FRACTURE left ankle;  Surgeon: Barton Drape, MD;  Location: [facility];  Service: Orthopedics;  Laterality: Left;  minimal anesthesia as possible   THYROIDECTOMY N/A 03/25/2021   Procedure: TOTAL THYROIDECTOMY;  Surgeon: Eletha Boas, MD;  Location: [facility] OR;  Service: General;  Laterality: N/A;    Family History  Problem Relation Age of Onset   Cancer Mother 6       breast   Breast cancer Mother 76   Stroke Father 71   Dementia Father 37   Colon polyps Brother 70   Skin cancer Brother    Cancer Maternal Uncle        dx 50s-60s   Esophageal cancer Neg Hx    Rectal cancer Neg Hx    Stomach cancer Neg Hx     Social History   Socioeconomic History   Marital status: Married    Spouse name: Not on file   Number of children: Not on file   Years of education: Not on  file   Highest education level: Not on file  Occupational History   Not on file  Tobacco Use   Smoking status: Never   Smokeless tobacco: Never  Vaping Use   Vaping status: Never Used  Substance and Sexual Activity   Alcohol use: Yes    Comment: occasional   Drug use: No   Sexual activity: Not Currently    Birth control/protection: Post-menopausal  Other Topics Concern   Not on file  Social History Narrative   Not on file   Social Drivers of Health   Financial Resource Strain: Not on file  Food Insecurity: Not on file  Transportation Needs: Not on file  Physical  Activity: Not on file  Stress: Not on file  Social Connections: Not on file    No current facility-administered medications on file prior to encounter.   Current Outpatient Medications on File Prior to Encounter  Medication Sig Dispense Refill   acetaminophen  (TYLENOL ) 325 MG tablet Take 2 tablets (650 mg total) by mouth every 6 (six) hours as needed for mild pain (or Fever >/= 101).     albuterol  (PROVENTIL ) (2.5 MG/3ML) 0.083% nebulizer solution Take 3 mLs (2.5 mg total) by nebulization every 6 (six) hours as needed for wheezing or shortness of breath. 75 mL 5   albuterol  (VENTOLIN  HFA) 108 (90 Base) MCG/ACT inhaler Inhale 2 puffs into the lungs every 6 (six) hours as needed for wheezing or shortness of breath. 8 g 3   b complex vitamins capsule Take 1 capsule by mouth daily.     budesonide -formoterol  (SYMBICORT ) 160-4.5 MCG/ACT inhaler Inhale 2 puffs into the lungs 2 (two) times daily. (Patient taking differently: Inhale 2 puffs into the lungs 2 (two) times daily as needed (shortness of breath).) 1 Inhaler 0   cetirizine (ZYRTEC) 10 MG tablet Take 10 mg by mouth daily.     Cholecalciferol (VITAMIN D3 GUMMIES PO) Take by mouth.     doxycycline  (VIBRA -TABS) 100 MG tablet Take 1 tablet (100 mg total) by mouth 2 (two) times daily. 14 tablet 0   esomeprazole (NEXIUM) 20 MG capsule Take 20 mg by mouth daily as needed (acid reflux).     fluticasone (FLONASE) 50 MCG/ACT nasal spray Place 2 sprays into both nostrils 2 (two) times daily as needed for allergies.     ibuprofen  (ADVIL ) 200 MG tablet Take 400-600 mg by mouth every 6 (six) hours as needed for headache or moderate pain.     levothyroxine  (SYNTHROID ) 88 MCG tablet Take 1 tablet (88 mcg total) by mouth daily before breakfast. (Patient taking differently: Take 75 mcg by mouth daily before breakfast. Taking 6 days a week.) 30 tablet 3   losartan  (COZAAR ) 25 MG tablet Take 25 mg by mouth daily.     montelukast  (SINGULAIR ) 10 MG tablet Take 10 mg  by mouth daily.     oxyCODONE -acetaminophen  (PERCOCET/ROXICET) 5-325 MG tablet Take 1 tablet by mouth every 4 (four) hours as needed for severe pain.     oxymetazoline (AFRIN) 0.05 % nasal spray Place 1 spray into both nostrils 2 (two) times daily as needed for congestion.     predniSONE  (DELTASONE ) 10 MG tablet 4 tabs for 2 days, then 3 tabs for 2 days, 2 tabs for 2 days, then 1 tab for 2 days, then stop 20 tablet 0   Spacer/Aero-Holding Chambers (AEROCHAMBER MINI CHAMBER) DEVI 1 Device by Does not apply route as directed. 1 Device 0   valACYclovir  (VALTREX ) 1000 MG tablet Take 1,000  mg by mouth daily as needed (fever blisters).      Review of Systems: a complete, 10pt review of systems was completed with pertinent positives and negatives as documented in the HPI  Physical Exam: Vitals:   10/27/23 0800 10/27/23 1025  BP: 115/70 139/60  Pulse: 69 73  Resp: 20 16  Temp: 98 F (36.7 C) 98.2 F (36.8 C)  SpO2: 95% 97%   Gen: A&Ox3, no distress  Chest: respiratory effort is normal.  Cardiovascular: RRR Gastrointestinal: Soft, nondistended, mildly subjectively tender in the low midline and left lower quadrant.  No peritoneal signs. Muscoloskeletal: no clubbing or cyanosis of the fingers.  Strength is symmetrical throughout.  Range of motion of bilateral upper and lower extremities normal without pain, crepitation or contracture. Neuro: no gross deficit Psych: appropriate mood and affect, normal insight/judgment intact  Skin: warm and dry      Latest Ref Rng & Units 10/26/2023    6:25 PM 09/05/2023    4:46 PM 03/23/2021    1:30 PM  CBC  WBC 4.0 - 10.5 K/uL 10.9  5.4  5.7   Hemoglobin 12.0 - 15.0 g/dL 86.6  86.8  85.9   Hematocrit 36.0 - 46.0 % 40.2  39.5  42.2   Platelets 150 - 400 K/uL 371  395.0  452        Latest Ref Rng & Units 10/26/2023    6:25 PM 03/26/2021    1:49 AM 03/23/2021    1:30 PM  CMP  Glucose 70 - 99 mg/dL 886  866  888   BUN 8 - 23 mg/dL 13  18  11     Creatinine 0.44 - 1.00 mg/dL 9.21  9.18  9.24   Sodium 135 - 145 mmol/L 135  134  135   Potassium 3.5 - 5.1 mmol/L 4.2  4.5  3.9   Chloride 98 - 111 mmol/L 100  104  102   CO2 22 - 32 mmol/L 23  22  26    Calcium  8.9 - 10.3 mg/dL 9.5  8.9  9.6   Total Protein 6.5 - 8.1 g/dL 6.9     Total Bilirubin 0.0 - 1.2 mg/dL 0.7     Alkaline Phos 38 - 126 U/L 139     AST 15 - 41 U/L 45     ALT 0 - 44 U/L 36       No results found for: INR, PROTIME  Imaging: CT ABDOMEN PELVIS W CONTRAST Result Date: 10/26/2023 CLINICAL DATA:  Loose stools lower pain EXAM: CT ABDOMEN AND PELVIS WITH CONTRAST TECHNIQUE: Multidetector CT imaging of the abdomen and pelvis was performed using the standard protocol following bolus administration of intravenous contrast. RADIATION DOSE REDUCTION: This exam was performed according to the departmental dose-optimization program which includes automated exposure control, adjustment of the mA and/or kV according to patient size and/or use of iterative reconstruction technique. CONTRAST:  OMNIPAQUE  IOHEXOL  300 MG/ML  SOLN COMPARISON:  CT 04/28/2010 FINDINGS: Lower chest: Lung bases demonstrate no acute airspace disease. Hepatobiliary: No focal liver abnormality is seen. No gallstones, gallbladder wall thickening, or biliary dilatation. Pancreas: Unremarkable. No pancreatic ductal dilatation or surrounding inflammatory changes. Spleen: Normal in size without focal abnormality. Adrenals/Urinary Tract: Adrenal glands are unremarkable. Kidneys are normal, without renal calculi, focal lesion, or hydronephrosis. Bladder is unremarkable. Stomach/Bowel: Stomach nonenlarged. No dilated small bowel. Fluid within the colon, likely corresponds to history of loose stools. Diverticular disease of the sigmoid colon. Focal wall thickening and inflammatory change  at the rectosigmoid colon. Dot of gas within the posterior pelvis series 4, image 50, appears to correspond to a small diverticulum on  sagittal views, series 9 image 98 but focal contained perforation not entirely excluded. Vascular/Lymphatic: Aortic atherosclerosis. No enlarged abdominal or pelvic lymph nodes. Reproductive: Uterus unremarkable.  No adnexal mass Other: Negative for pelvic effusion or free air Musculoskeletal: Trace retrolisthesis L1 on L2 with degenerative changes. No acute osseous abnormality IMPRESSION: 1. Focal wall thickening and inflammation involving the rectosigmoid colon suspected to be secondary to acute diverticulitis or possible colitis. Single dot of gas within the posterior pelvic fat, appears to represent small diverticulum on sagittal views but cannot entirely exclude small focus of contained perforation. No evidence for free air or abscess. Colonoscopy follow-up after resolution of acute symptoms is recommended to exclude other causes for focal colon inflammation. 2. Fluid within the colon, likely corresponds to history of loose stools. 3. Aortic atherosclerosis. Aortic Atherosclerosis (ICD10-I70.0). Electronically Signed   By: Luke Bun M.D.   On: 10/26/2023 21:31     A/P: 63 year old woman with rectosigmoid diverticulitis versus colitis and questionable microperforation.  Recommend bowel rest, broad-spectrum IV antibiotics and continue serial abdominal exams/labs.  Anticipate this will resolve with medical therapy and will need GI follow-up for follow-up colonoscopy in 6 to 8 weeks once resolved.  Discussed possibility of failure to improve with medical treatment and subsequent need for repeat imaging, possible percutaneous drainage, possible surgery.  Surgery team will continue to follow.    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

## 2023-10-27 NOTE — H&P (Signed)
 History and Physical    Jennifer Evans FMW:[id number] DOB: Apr 16, 1960 DOA: 10/26/2023  DOS: the patient was seen and examined on 10/26/2023  PCP: Marvene Prentice SAUNDERS, FNP   Patient coming from: Home  I have personally briefly reviewed patient's old medical records in [facility]  HPI: 63 year old female history of hypertension, intermittent asthma, history of surgical hypothyroidism presents to the ER today with 2 days of crampy abdominal pain and loose stools.  She states that she went out to dinner on Thursday.  By Thursday evening, she started having some abdominal uneasiness.  She had a loose stool that evening.  She developed fever in the last 24 hours of 103.  She started having chills.  Occasional nausea but no vomiting.  She has never had a bout of diverticulitis before.  She came to the ER for evaluation.  No chest pain.  No shortness of breath.  Denies any dysuria.  Pains mostly in the left lower quadrant and suprapubic area.  Has been having loose stools since Friday night.  On arrival to the ER, temp 101.3 heart rate 106 blood pressure 140/74 satting 97% on room air.  White count 10.9, hemoglobin 13.3, platelets of 371  Lipase 24  Sodium 135, potassium 4.2, chloride 100, bicarb 23, BUN 13, creatinine 0.78, glucose 113  Total protein 6.9, BUN 4.2, AST 45, ALT 236, alk phos 139, total bili 0.7  Lactic acid normal at 0.6  UA showed trace hemoglobin.  Negative nitrite.  CT abdomen pelvis showed focal thickening and fluid inflammation around the rectosigmoid colon.  Suspected to be acute diverticulitis.  Significant Events: Admitted 10/26/2023 for acute sigmoid diverticulitis   Admission Labs: White count 10.9, hemoglobin 13.3, platelets of 371 Lipase 24 Sodium 135, potassium 4.2, chloride 100, bicarb 23, BUN 13, creatinine 0.78, glucose 113 Total protein 6.9, BUN 4.2, AST 45, ALT 236, alk phos 139, total bili 0.7 Lactic acid normal at 0.6 UA showed trace hemoglobin.   Negative nitrite.  Admission Imaging Studies: CT abdomen pelvis showed focal thickening and fluid inflammation around the rectosigmoid colon.  Suspected to be acute diverticulitis.  Significant Labs:   Significant Imaging Studies:   Antibiotic Therapy: Anti-infectives (From admission, onward)    Start     Dose/Rate Route Frequency Ordered Stop   10/27/23 1245  ciprofloxacin  (CIPRO ) tablet 500 mg        500 mg Oral 2 times daily 10/27/23 1148     10/27/23 1245  metroNIDAZOLE  (FLAGYL ) tablet 500 mg        500 mg Oral Every 12 hours 10/27/23 1148     10/27/23 0600  piperacillin -tazobactam (ZOSYN ) IVPB 3.375 g  Status:  Discontinued       Placed in Followed by Linked Group   3.375 g 12.5 mL/hr over 240 Minutes Intravenous Every 8 hours 10/26/23 1959 10/27/23 1148   10/26/23 2000  piperacillin -tazobactam (ZOSYN ) IVPB 3.375 g       Placed in Followed by Linked Group   3.375 g 100 mL/hr over 30 Minutes Intravenous  Once 10/26/23 1959 10/26/23 2138       Procedures:   Consultants: surgery    Review of Systems:  Review of Systems  Constitutional:  Positive for chills and fever. Negative for malaise/fatigue.  HENT: Negative.    Eyes: Negative.   Respiratory: Negative.    Cardiovascular: Negative.   Gastrointestinal:  Positive for abdominal pain, diarrhea and nausea. Negative for vomiting.  Genitourinary: Negative.   Musculoskeletal: Negative.  Skin: Negative.   Neurological: Negative.   Endo/Heme/Allergies: Negative.   Psychiatric/Behavioral: Negative.    All other systems reviewed and are negative.   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   Sinus  headache    Thyroid  disease    hx of thyroid  nodule (stable)    Past Surgical History:  Procedure Laterality Date   APPENDECTOMY  2007   benign thyroid  nodule removed  1999   BREAST EXCISIONAL BIOPSY Right    COLONOSCOPY  12/2009   LYMPH NODE DISSECTION N/A 03/25/2021   Procedure: LIMITED LYMPH NODE DISSECTION;  Surgeon: Eletha Boas, MD;  Location: [facility] OR;  Service: General;  Laterality: N/A;   ORIF ANKLE FRACTURE Left 11/16/2021   Procedure: OPEN REDUCTION INTERNAL FIXATION (ORIF) ANKLE FRACTURE left ankle;  Surgeon: Barton Drape, MD;  Location: [facility];  Service: Orthopedics;  Laterality: Left;  minimal anesthesia as possible   THYROIDECTOMY N/A 03/25/2021   Procedure: TOTAL THYROIDECTOMY;  Surgeon: Eletha Boas, MD;  Location: [facility] OR;  Service: General;  Laterality: N/A;     reports that she has never smoked. She has never used smokeless tobacco. She reports current alcohol use. She reports that she does not use drugs.  Allergies  Allergen Reactions   Ketamine Other (See Comments)    Hallucinations, heart stress     Family History  Problem Relation Age of Onset   Cancer Mother 65       breast   Breast cancer Mother 4   Stroke Father 43   Dementia Father 29   Colon polyps Brother 12   Skin cancer Brother    Cancer Maternal Uncle        dx 50s-60s   Esophageal cancer Neg Hx    Rectal cancer Neg Hx    Stomach cancer Neg Hx     Prior to Admission medications   Medication Sig Start Date End Date Taking? Authorizing Provider  albuterol  (PROVENTIL ) (2.5 MG/3ML) 0.083% nebulizer solution Take 3 mLs (2.5 mg total) by nebulization every 6 (six) hours as needed for wheezing or shortness of breath. 09/05/23   Cobb, Comer GAILS, NP  albuterol  (VENTOLIN  HFA) 108 (90 Base) MCG/ACT inhaler Inhale 2 puffs into the lungs every 6 (six) hours as needed for wheezing or shortness of breath. 09/05/23   Cobb, Comer GAILS, NP  b complex vitamins capsule Take 1 capsule by  mouth daily.    [provider]  budesonide -formoterol  (SYMBICORT ) 160-4.5 MCG/ACT inhaler Inhale 2 puffs into the lungs 2 (two) times daily. Patient taking differently: Inhale 2 puffs into the lungs 2 (two) times daily as needed (shortness of breath). 12/05/17   Icard, Adine CROME, DO  cetirizine (ZYRTEC) 10 MG tablet Take 10 mg by mouth daily.    [provider]  Cholecalciferol (VITAMIN D3 GUMMIES PO) Take by mouth.    [provider]  fluticasone (FLONASE) 50 MCG/ACT nasal spray Place 2 sprays into both nostrils 2 (two) times daily as needed for allergies. 11/07/18   [provider]  ibuprofen  (ADVIL ) 200 MG tablet  Take 400-600 mg by mouth every 6 (six) hours as needed for headache or moderate pain.    [provider]  levothyroxine  (SYNTHROID ) 75 MCG tablet Take 75 mcg by mouth every morning.    [provider]  losartan  (COZAAR ) 25 MG tablet Take 25 mg by mouth daily.    [provider]  montelukast  (SINGULAIR ) 10 MG tablet Take 10 mg by mouth daily. 11/01/18   [provider]  oxymetazoline (AFRIN) 0.05 % nasal spray Place 1 spray into both nostrils 2 (two) times daily as needed for congestion.    [provider]  pravastatin  (PRAVACHOL ) 10 MG tablet Take 10 mg by mouth daily.    [provider]  Spacer/Aero-Holding Chambers (AEROCHAMBER MINI CHAMBER) DEVI 1 Device by Does not apply route as directed. 11/15/17   Icard, Adine CROME, DO  valACYclovir  (VALTREX ) 1000 MG tablet Take 1,000 mg by mouth daily as needed (fever blisters).    [provider]    Physical Exam: Vitals:   10/27/23 0045 10/27/23 0500 10/27/23 0800 10/27/23 1025  BP: 110/61 (!) 106/55 115/70 139/60  Pulse: 75 74 69 73  Resp: 18 16 20 16   Temp:  97.8 F (36.6 C) 98 F (36.7 C) 98.2 F (36.8 C)  TempSrc:  Oral Oral Oral  SpO2: 98% 99% 95% 97%  Weight:      Height:        Physical Exam Vitals and nursing note reviewed.   Constitutional:      General: She is not in acute distress.    Appearance: She is not toxic-appearing or diaphoretic.  HENT:     Head: Normocephalic and atraumatic.     Nose: Nose normal.  Eyes:     General: No scleral icterus. Cardiovascular:     Rate and Rhythm: Normal rate and regular rhythm.     Pulses: Normal pulses.  Pulmonary:     Effort: Pulmonary effort is normal.     Breath sounds: Normal breath sounds.  Abdominal:     General: Abdomen is flat. Bowel sounds are normal. There is no distension.     Palpations: Abdomen is soft.     Tenderness: There is no guarding or rebound.   Musculoskeletal:     Right lower leg: No edema.     Left lower leg: No edema.  Skin:    General: Skin is warm and dry.     Capillary Refill: Capillary refill takes less than 2 seconds.  Neurological:     General: No focal deficit present.     Mental Status: She is alert and oriented to person, place, and time.      Labs on Admission: I have personally reviewed following labs and imaging studies  CBC: Recent Labs  Lab 10/26/23 1825  WBC 10.9*  HGB 13.3  HCT 40.2  MCV 92.0  PLT 371   Basic Metabolic Panel: Recent Labs  Lab 10/26/23 1825  NA 135  K 4.2  CL 100  CO2 23  GLUCOSE 113*  BUN 13  CREATININE 0.78  CALCIUM  9.5   GFR: Estimated Creatinine Clearance: 59.5 mL/min (by C-G formula based on SCr of 0.78 mg/dL). Liver Function Tests: Recent Labs  Lab 10/26/23 1825  AST 45*  ALT 36  ALKPHOS 139*  BILITOT 0.7  PROT 6.9  ALBUMIN 4.2   Recent Labs  Lab 10/26/23 1825  LIPASE 24   Urine analysis:    Component Value Date/Time   COLORURINE AMBER (A) 10/26/2023 1825  APPEARANCEUR CLEAR 10/26/2023 1825   LABSPEC 1.020 10/26/2023 1825   PHURINE 7.0 10/26/2023 1825   GLUCOSEU NEGATIVE 10/26/2023 1825   HGBUR TRACE (A) 10/26/2023 1825   BILIRUBINUR SMALL (A) 10/26/2023 1825   KETONESUR NEGATIVE 10/26/2023 1825   PROTEINUR 30 (A) 10/26/2023 1825   UROBILINOGEN 0.2  04/28/2010 1036   NITRITE NEGATIVE 10/26/2023 1825   LEUKOCYTESUR TRACE (A) 10/26/2023 1825    Radiological Exams on Admission: I have personally reviewed images CT ABDOMEN PELVIS W CONTRAST Result Date: 10/26/2023 CLINICAL DATA:  Loose stools lower pain EXAM: CT ABDOMEN AND PELVIS WITH CONTRAST TECHNIQUE: Multidetector CT imaging of the abdomen and pelvis was performed using the standard protocol following bolus administration of intravenous contrast. RADIATION DOSE REDUCTION: This exam was performed according to the departmental dose-optimization program which includes automated exposure control, adjustment of the mA and/or kV according to patient size and/or use of iterative reconstruction technique. CONTRAST:  OMNIPAQUE  IOHEXOL  300 MG/ML  SOLN COMPARISON:  CT 04/28/2010 FINDINGS: Lower chest: Lung bases demonstrate no acute airspace disease. Hepatobiliary: No focal liver abnormality is seen. No gallstones, gallbladder wall thickening, or biliary dilatation. Pancreas: Unremarkable. No pancreatic ductal dilatation or surrounding inflammatory changes. Spleen: Normal in size without focal abnormality. Adrenals/Urinary Tract: Adrenal glands are unremarkable. Kidneys are normal, without renal calculi, focal lesion, or hydronephrosis. Bladder is unremarkable. Stomach/Bowel: Stomach nonenlarged. No dilated small bowel. Fluid within the colon, likely corresponds to history of loose stools. Diverticular disease of the sigmoid colon. Focal wall thickening and inflammatory change at the rectosigmoid colon. Dot of gas within the posterior pelvis series 4, image 50, appears to correspond to a small diverticulum on sagittal views, series 9 image 98 but focal contained perforation not entirely excluded. Vascular/Lymphatic: Aortic atherosclerosis. No enlarged abdominal or pelvic lymph nodes. Reproductive: Uterus unremarkable.  No adnexal mass Other: Negative for pelvic effusion or free air Musculoskeletal: Trace  retrolisthesis L1 on L2 with degenerative changes. No acute osseous abnormality IMPRESSION: 1. Focal wall thickening and inflammation involving the rectosigmoid colon suspected to be secondary to acute diverticulitis or possible colitis. Single dot of gas within the posterior pelvic fat, appears to represent small diverticulum on sagittal views but cannot entirely exclude small focus of contained perforation. No evidence for free air or abscess. Colonoscopy follow-up after resolution of acute symptoms is recommended to exclude other causes for focal colon inflammation. 2. Fluid within the colon, likely corresponds to history of loose stools. 3. Aortic atherosclerosis. Aortic Atherosclerosis (ICD10-I70.0). Electronically Signed   By: Luke Bun M.D.   On: 10/26/2023 21:31    EKG: My personal interpretation of EKG shows: no EKG to review  Assessment/Plan Principal Problem:   Sigmoid diverticulitis Active Problems:   Mild intermittent asthma   Status post total thyroidectomy   Assessment and Plan: * Sigmoid diverticulitis 10-27-2023 admit to med/surg bed. Pt without peritoneal signs. No vomiting. She is able to take po meds. Still start po cipro /flagyl . Allow for clear liquids. Prn IV dilaudid  and prn po ultram . Pt wants to make sure she can tolerate liquids without nausea. Start scheduled IV zofran . She is already able to drink sweet tea without difficulty.  Status post total thyroidectomy 10-27-2023 continue with synthroid .  Mild intermittent asthma 10-27-2023 continue with singulair . Prn albuterol .   DVT prophylaxis: Lovenox  Code Status: Full Code Family Communication: discussed with pt and husband doug at bedside  Disposition Plan: home  Consults called: general surgery  Admission status: Inpatient, Med-Surg   Camellia Door, DO [facility]  10/27/2023, 12:51 PM

## 2023-10-28 DIAGNOSIS — Z9889 Other specified postprocedural states: Secondary | ICD-10-CM | POA: Diagnosis not present

## 2023-10-28 DIAGNOSIS — K5732 Diverticulitis of large intestine without perforation or abscess without bleeding: Secondary | ICD-10-CM | POA: Diagnosis not present

## 2023-10-28 DIAGNOSIS — J4520 Mild intermittent asthma, uncomplicated: Secondary | ICD-10-CM | POA: Diagnosis not present

## 2023-10-28 DIAGNOSIS — Z9089 Acquired absence of other organs: Secondary | ICD-10-CM | POA: Diagnosis not present

## 2023-10-28 LAB — BASIC METABOLIC PANEL WITH GFR
Anion gap: 9 (ref 5–15)
BUN: 9 mg/dL (ref 8–23)
CO2: 22 mmol/L (ref 22–32)
Calcium: 8.5 mg/dL — ABNORMAL LOW (ref 8.9–10.3)
Chloride: 104 mmol/L (ref 98–111)
Creatinine, Ser: 0.72 mg/dL (ref 0.44–1.00)
GFR, Estimated: >60 mL/min (ref >60)
Glucose, Bld: 120 mg/dL — ABNORMAL HIGH (ref 70–99)
Potassium: 4 mmol/L (ref 3.5–5.1)
Sodium: 135 mmol/L (ref 135–145)

## 2023-10-28 LAB — CBC
HCT: 36.2 % (ref 36.0–46.0)
Hemoglobin: 11.8 g/dL — ABNORMAL LOW (ref 12.0–15.0)
MCH: 30.3 pg (ref 26.0–34.0)
MCHC: 32.6 g/dL (ref 30.0–36.0)
MCV: 93.1 fL (ref 80.0–100.0)
Platelets: 317 K/uL (ref 150–400)
RBC: 3.89 MIL/uL (ref 3.87–5.11)
RDW: 13.7 % (ref 11.5–15.5)
WBC: 5.2 K/uL (ref 4.0–10.5)
nRBC: 0 % (ref 0.0–0.2)

## 2023-10-28 LAB — MAGNESIUM: Magnesium: 1.9 mg/dL (ref 1.7–2.4)

## 2023-10-28 LAB — HIV ANTIBODY (ROUTINE TESTING W REFLEX): HIV Screen 4th Generation wRfx: NONREACTIVE

## 2023-10-28 MED ORDER — CIPROFLOXACIN HCL 500 MG PO TABS: 500.0000 mg | ORAL_TABLET | Freq: Two times a day (BID) | ORAL | 0 refills | Status: AC

## 2023-10-28 MED ORDER — ONDANSETRON 4 MG PO TBDP
4.0000 mg | ORAL_TABLET | Freq: Four times a day (QID) | ORAL | Status: DC | PRN
  Administered 2023-10-28: 4 mg via ORAL
  Filled 2023-10-28: qty 1

## 2023-10-28 MED ORDER — SODIUM CHLORIDE 0.9 % IV SOLN: 25.0000 mg | Freq: Four times a day (QID) | INTRAVENOUS | Status: DC | PRN

## 2023-10-28 MED ORDER — PROMETHAZINE HCL 25 MG PO TABS: 25.0000 mg | ORAL_TABLET | Freq: Four times a day (QID) | ORAL | 0 refills | Status: AC | PRN

## 2023-10-28 MED ORDER — ONDANSETRON HCL 4 MG/2ML IJ SOLN: 4.0000 mg | Freq: Four times a day (QID) | INTRAMUSCULAR | Status: DC | PRN

## 2023-10-28 MED ORDER — PROMETHAZINE HCL 12.5 MG PO TABS: 25.0000 mg | ORAL_TABLET | Freq: Four times a day (QID) | ORAL | Status: DC | PRN

## 2023-10-28 MED ORDER — METRONIDAZOLE 500 MG PO TABS: 500.0000 mg | ORAL_TABLET | Freq: Two times a day (BID) | ORAL | 0 refills | Status: AC

## 2023-10-28 MED ORDER — PROMETHAZINE HCL 25 MG RE SUPP: 25.0000 mg | Freq: Four times a day (QID) | RECTAL | 0 refills | Status: AC | PRN

## 2023-10-28 MED ORDER — SODIUM CHLORIDE 0.9 % IV SOLN
25.0000 mg | Freq: Once | INTRAVENOUS | Status: AC
  Administered 2023-10-28: 25 mg via INTRAVENOUS
  Filled 2023-10-28: qty 1

## 2023-10-28 MED ORDER — PROMETHAZINE HCL 25 MG RE SUPP: 25.0000 mg | Freq: Four times a day (QID) | RECTAL | Status: DC | PRN

## 2023-10-28 MED ORDER — VALACYCLOVIR HCL 500 MG PO TABS
1000.0000 mg | ORAL_TABLET | Freq: Every day | ORAL | Status: DC
  Administered 2023-10-28: 1000 mg via ORAL
  Filled 2023-10-28: qty 2

## 2023-10-28 MED ORDER — ONDANSETRON 4 MG PO TBDP: 4.0000 mg | ORAL_TABLET | Freq: Four times a day (QID) | ORAL | 0 refills | Status: AC | PRN

## 2023-10-28 MED ORDER — TRAMADOL HCL 50 MG PO TABS: 50.0000 mg | ORAL_TABLET | Freq: Four times a day (QID) | ORAL | 0 refills | Status: AC | PRN

## 2023-10-28 NOTE — Progress Notes (Signed)
   Tolerated solid food for breakfast and lunch. No abd pain. No N/V. Pt ready to go home.  Camellia Door, DO [facility]

## 2023-10-28 NOTE — Discharge Summary (Signed)
 [facility] Physician Discharge Summary   Patient name: Jennifer Evans  Admit date:     10/26/2023  Discharge date: 10/28/2023  Attending Physician: REMONIA ALM PARAS [[id number]]  Discharge Physician: Camellia Door   PCP: Marvene Prentice SAUNDERS, FNP  Admitted From: Home  Disposition:  Home  Recommendations for Outpatient Follow-up:  Follow up with PCP in 1-2 weeks Follow up with [facility] in 6 weeks  Home Health:No Equipment/Devices: None    Discharge Condition:Stable CODE STATUS:FULL Diet recommendation: low fiber, soft GI diet Fluid Restriction: None  Hospital Summary: HPI: 63 year old female history of hypertension, intermittent asthma, history of surgical hypothyroidism presents to the ER today with 2 days of crampy abdominal pain and loose stools.  She states that she went out to dinner on Thursday.  By Thursday evening, she started having some abdominal uneasiness.  She had a loose stool that evening.  She developed fever in the last 24 hours of 103.  She started having chills.  Occasional nausea but no vomiting.  She has never had a bout of diverticulitis before.  She came to the ER for evaluation.  No chest pain.  No shortness of breath.  Denies any dysuria.  Pains mostly in the left lower quadrant and suprapubic area.  Has been having loose stools since Friday night.  On arrival to the ER, temp 101.3 heart rate 106 blood pressure 140/74 satting 97% on room air.  White count 10.9, hemoglobin 13.3, platelets of 371  Lipase 24  Sodium 135, potassium 4.2, chloride 100, bicarb 23, BUN 13, creatinine 0.78, glucose 113  Total protein 6.9, BUN 4.2, AST 45, ALT 236, alk phos 139, total bili 0.7  Lactic acid normal at 0.6  UA showed trace hemoglobin.  Negative nitrite.  CT abdomen pelvis showed focal thickening and fluid inflammation around the rectosigmoid colon.  Suspected to be acute diverticulitis.  Significant Events: Admitted 10/26/2023 for acute sigmoid  diverticulitis   Admission Labs: White count 10.9, hemoglobin 13.3, platelets of 371 Lipase 24 Sodium 135, potassium 4.2, chloride 100, bicarb 23, BUN 13, creatinine 0.78, glucose 113 Total protein 6.9, BUN 4.2, AST 45, ALT 236, alk phos 139, total bili 0.7 Lactic acid normal at 0.6 UA showed trace hemoglobin.  Negative nitrite.  Admission Imaging Studies: CT abdomen pelvis showed focal thickening and fluid inflammation around the rectosigmoid colon.  Suspected to be acute diverticulitis.  Significant Labs:   Significant Imaging Studies:   Antibiotic Therapy: Anti-infectives (From admission, onward)    Start     Dose/Rate Route Frequency Ordered Stop   10/27/23 1245  ciprofloxacin  (CIPRO ) tablet 500 mg        500 mg Oral 2 times daily 10/27/23 1148     10/27/23 1245  metroNIDAZOLE  (FLAGYL ) tablet 500 mg        500 mg Oral Every 12 hours 10/27/23 1148     10/27/23 0600  piperacillin -tazobactam (ZOSYN ) IVPB 3.375 g  Status:  Discontinued       Placed in Followed by Linked Group   3.375 g 12.5 mL/hr over 240 Minutes Intravenous Every 8 hours 10/26/23 1959 10/27/23 1148   10/26/23 2000  piperacillin -tazobactam (ZOSYN ) IVPB 3.375 g       Placed in Followed by Linked Group   3.375 g 100 mL/hr over 30 Minutes Intravenous  Once 10/26/23 1959 10/26/23 2138       Procedures:   Consultants: surgery   Hospital Course by Problem: * Sigmoid diverticulitis 10-27-2023 admit to med/surg bed. Pt  without peritoneal signs. No vomiting. She is able to take po meds. Still start po cipro /flagyl . Allow for clear liquids. Prn IV dilaudid  and prn po ultram . Pt wants to make sure she can tolerate liquids without nausea. Start scheduled IV zofran . She is already able to drink sweet tea without difficulty.  10-28-2023 tolerating po cipro /flagyl . I do not think she has a perforation. Pt has minimal abd pain. No leukocytosis. No fevers. Pt wants to try solid food. Will advance to soft GI  diet. Prn zofran . If she is able to tolerate 2 solid meals, she can be discharged to home and f/u with PCP next week to make sure her diverticulitis has resolved.  Status post total thyroidectomy 10-27-2023 continue with synthroid .  10-28-2023 stable.  Mild intermittent asthma 10-27-2023 continue with singulair . Prn albuterol .  10-28-2023 stable.    Discharge Diagnoses:  Principal Problem:   Sigmoid diverticulitis Active Problems:   Mild intermittent asthma   Status post total thyroidectomy   Discharge Instructions  Discharge Instructions     Call MD for:  difficulty breathing, headache or visual disturbances   Complete by: As directed    Call MD for:  extreme fatigue   Complete by: As directed    Call MD for:  hives   Complete by: As directed    Call MD for:  persistant dizziness or light-headedness   Complete by: As directed    Call MD for:  persistant nausea and vomiting   Complete by: As directed    Call MD for:  redness, tenderness, or signs of infection (pain, swelling, redness, odor or green/yellow discharge around incision site)   Complete by: As directed    Call MD for:  severe uncontrolled pain   Complete by: As directed    Call MD for:  temperature >100.4   Complete by: As directed    DIET SOFT   Complete by: As directed    Discharge instructions   Complete by: As directed    1. Follow up with your primary care provider in 1-2 weeks following discharge from hospital. 2. Follow up with [facility] in 6 weeks. Call office for appointment.   Increase activity slowly   Complete by: As directed       Allergies as of 10/28/2023       Reactions   Ketamine Other (See Comments)   Hallucinations, heart stress         Medication List     TAKE these medications    AeroChamber Mini Chamber Devi 1 Device by Does not apply route as directed.   albuterol  (2.5 MG/3ML) 0.083% nebulizer solution Commonly known as: PROVENTIL  Take 3 mLs (2.5 mg total) by  nebulization every 6 (six) hours as needed for wheezing or shortness of breath.   albuterol  108 (90 Base) MCG/ACT inhaler Commonly known as: VENTOLIN  HFA Inhale 2 puffs into the lungs every 6 (six) hours as needed for wheezing or shortness of breath.   budesonide -formoterol  160-4.5 MCG/ACT inhaler Commonly known as: Symbicort  Inhale 2 puffs into the lungs 2 (two) times daily. What changed:  when to take this reasons to take this   cetirizine 10 MG tablet Commonly known as: ZYRTEC Take 10 mg by mouth daily.   ciprofloxacin  500 MG tablet Commonly known as: CIPRO  Take 1 tablet (500 mg total) by mouth 2 (two) times daily for 8 days.   fluticasone 50 MCG/ACT nasal spray Commonly known as: FLONASE Place 2 sprays into both nostrils 2 (two) times daily as  needed for allergies.   ibuprofen  200 MG tablet Commonly known as: ADVIL  Take 400-600 mg by mouth every 6 (six) hours as needed for headache or moderate pain.   levothyroxine  75 MCG tablet Commonly known as: SYNTHROID  Take 75 mcg by mouth See admin instructions. Everyday except Sunday   losartan  25 MG tablet Commonly known as: COZAAR  Take 25 mg by mouth every evening.   MAGNESIUM GLYCINATE PO Take 1 capsule by mouth as needed (sleep). 250mg    metroNIDAZOLE  500 MG tablet Commonly known as: FLAGYL  Take 1 tablet (500 mg total) by mouth every 12 (twelve) hours for 8 days.   montelukast  10 MG tablet Commonly known as: SINGULAIR  Take 10 mg by mouth every evening.   ondansetron  4 MG disintegrating tablet Commonly known as: ZOFRAN -ODT Take 1 tablet (4 mg total) by mouth every 6 (six) hours as needed for nausea or vomiting.   pravastatin  10 MG tablet Commonly known as: PRAVACHOL  Take 10 mg by mouth every evening.   promethazine  25 MG suppository Commonly known as: PHENERGAN  Place 1 suppository (25 mg total) rectally every 6 (six) hours as needed for nausea.   promethazine  25 MG tablet Commonly known as: PHENERGAN  Take 1  tablet (25 mg total) by mouth every 6 (six) hours as needed for nausea or vomiting.   traMADol  50 MG tablet Commonly known as: ULTRAM  Take 1 tablet (50 mg total) by mouth every 6 (six) hours as needed for up to 3 days for moderate pain (pain score 4-6).   valACYclovir  1000 MG tablet Commonly known as: VALTREX  Take 1,000 mg by mouth daily as needed (fever blisters).   VITAMIN D3 GUMMIES PO Take 1 each by mouth daily.        Follow-up Information     Marvene Prentice SAUNDERS, FNP. Schedule an appointment as soon as possible for a visit in 2 week(s).   Specialty: Family Medicine Contact information: [phone] [address] [place] [address] [phone]         [facility]. Schedule an appointment as soon as possible for a visit in 6 week(s).   Specialty: Gastroenterology Contact information: [address] [place]  [address] [phone]               Allergies  Allergen Reactions   Ketamine Other (See Comments)    Hallucinations, heart stress     Discharge Exam: Vitals:   10/28/23 0821 10/28/23 1155  BP: 132/64 128/79  Pulse: 73 85  Resp: 16 16  Temp: 97.7 F (36.5 C) 98.6 F (37 C)  SpO2: 95% 98%    Physical Exam Vitals and nursing note reviewed.  Constitutional:      General: She is not in acute distress.    Appearance: She is normal weight. She is not toxic-appearing or diaphoretic.  HENT:     Head: Normocephalic and atraumatic.     Nose: Nose normal.  Eyes:     General: No scleral icterus. Cardiovascular:     Rate and Rhythm: Normal rate and regular rhythm.  Pulmonary:     Effort: Pulmonary effort is normal.     Breath sounds: Normal breath sounds.  Abdominal:     General: Abdomen is flat. Bowel sounds are normal. There is no distension.     Palpations: Abdomen is soft.     Tenderness: There is no abdominal tenderness. There is no guarding or rebound.  Musculoskeletal:     Right lower  leg: No edema.  Left lower leg: No edema.  Skin:    General: Skin is warm and dry.     Capillary Refill: Capillary refill takes less than 2 seconds.  Neurological:     General: No focal deficit present.     Mental Status: She is alert and oriented to person, place, and time.     The results of significant diagnostics from this hospitalization (including imaging, microbiology, ancillary and laboratory) are listed below for reference.    Microbiology: Recent Results (from the past 240 hours)  Blood culture (routine x 2)     Status: None (Preliminary result)   Collection Time: 10/26/23  7:43 PM   Specimen: BLOOD  Result Value Ref Range Status   Specimen Description   Final    BLOOD LEFT ANTECUBITAL Performed at [facility], [address] [place], [place] [address]    Special Requests   Final    BOTTLES DRAWN AEROBIC AND ANAEROBIC Blood Culture adequate volume Performed at [facility], [address] [place], [place] [address]    Culture   Final    NO GROWTH 2 DAYS Performed at [facility], [address] [place], [place] [address]    Report Status PENDING  Incomplete  Blood culture (routine x 2)     Status: None (Preliminary result)   Collection Time: 10/26/23  8:10 PM   Specimen: BLOOD  Result Value Ref Range Status   Specimen Description   Final    BLOOD RIGHT ANTECUBITAL Performed at [facility], [address] [place], [place] [address]    Special Requests   Final    BOTTLES DRAWN AEROBIC AND ANAEROBIC Blood Culture adequate volume Performed at [facility], [address] [place], [place] [address]    Culture   Final    NO GROWTH 2 DAYS Performed at [facility], [address], [place], [place] [address]    Report Status PENDING  Incomplete     Labs:  Basic Metabolic Panel: Recent Labs  Lab 10/26/23 1825 10/28/23 0350  NA 135 135  K 4.2 4.0  CL 100 104  CO2 23 22  GLUCOSE 113* 120*   BUN 13 9  CREATININE 0.78 0.72  CALCIUM  9.5 8.5*  MG  --  1.9   Liver Function Tests: Recent Labs  Lab 10/26/23 1825  AST 45*  ALT 36  ALKPHOS 139*  BILITOT 0.7  PROT 6.9  ALBUMIN 4.2   Recent Labs  Lab 10/26/23 1825  LIPASE 24   CBC: Recent Labs  Lab 10/26/23 1825 10/28/23 0350  WBC 10.9* 5.2  HGB 13.3 11.8*  HCT 40.2 36.2  MCV 92.0 93.1  PLT 371 317   Urinalysis    Component Value Date/Time   COLORURINE AMBER (A) 10/26/2023 1825   APPEARANCEUR CLEAR 10/26/2023 1825   LABSPEC 1.020 10/26/2023 1825   PHURINE 7.0 10/26/2023 1825   GLUCOSEU NEGATIVE 10/26/2023 1825   HGBUR TRACE (A) 10/26/2023 1825   BILIRUBINUR SMALL (A) 10/26/2023 1825   KETONESUR NEGATIVE 10/26/2023 1825   PROTEINUR 30 (A) 10/26/2023 1825   UROBILINOGEN 0.2 04/28/2010 1036   NITRITE NEGATIVE 10/26/2023 1825   LEUKOCYTESUR TRACE (A) 10/26/2023 1825   Sepsis Labs Recent Labs  Lab 10/26/23 1825 10/28/23 0350  WBC 10.9* 5.2    Procedures/Studies: CT ABDOMEN PELVIS W CONTRAST Result Date: 10/26/2023 CLINICAL DATA:  Loose stools lower pain EXAM: CT ABDOMEN AND PELVIS WITH  CONTRAST TECHNIQUE: Multidetector CT imaging of the abdomen and pelvis was performed using the standard protocol following bolus administration of intravenous contrast. RADIATION DOSE REDUCTION: This exam was performed according to the departmental dose-optimization program which includes automated exposure control, adjustment of the mA and/or kV according to patient size and/or use of iterative reconstruction technique. CONTRAST:  OMNIPAQUE  IOHEXOL  300 MG/ML  SOLN COMPARISON:  CT 04/28/2010 FINDINGS: Lower chest: Lung bases demonstrate no acute airspace disease. Hepatobiliary: No focal liver abnormality is seen. No gallstones, gallbladder wall thickening, or biliary dilatation. Pancreas: Unremarkable. No pancreatic ductal dilatation or surrounding inflammatory changes. Spleen: Normal in size without focal abnormality.  Adrenals/Urinary Tract: Adrenal glands are unremarkable. Kidneys are normal, without renal calculi, focal lesion, or hydronephrosis. Bladder is unremarkable. Stomach/Bowel: Stomach nonenlarged. No dilated small bowel. Fluid within the colon, likely corresponds to history of loose stools. Diverticular disease of the sigmoid colon. Focal wall thickening and inflammatory change at the rectosigmoid colon. Dot of gas within the posterior pelvis series 4, image 50, appears to correspond to a small diverticulum on sagittal views, series 9 image 98 but focal contained perforation not entirely excluded. Vascular/Lymphatic: Aortic atherosclerosis. No enlarged abdominal or pelvic lymph nodes. Reproductive: Uterus unremarkable.  No adnexal mass Other: Negative for pelvic effusion or free air Musculoskeletal: Trace retrolisthesis L1 on L2 with degenerative changes. No acute osseous abnormality IMPRESSION: 1. Focal wall thickening and inflammation involving the rectosigmoid colon suspected to be secondary to acute diverticulitis or possible colitis. Single dot of gas within the posterior pelvic fat, appears to represent small diverticulum on sagittal views but cannot entirely exclude small focus of contained perforation. No evidence for free air or abscess. Colonoscopy follow-up after resolution of acute symptoms is recommended to exclude other causes for focal colon inflammation. 2. Fluid within the colon, likely corresponds to history of loose stools. 3. Aortic atherosclerosis. Aortic Atherosclerosis (ICD10-I70.0). Electronically Signed   By: Luke Bun M.D.   On: 10/26/2023 21:31    Time coordinating discharge: 55 mins  SIGNED:  Camellia Door, DO [facility] 10/28/23, 2:52 PM

## 2023-10-28 NOTE — Plan of Care (Signed)
   Problem: Education: Goal: Knowledge of General Education information will improve Description Including pain rating scale, medication(s)/side effects and non-pharmacologic comfort measures Outcome: Progressing

## 2023-10-28 NOTE — Subjective & Objective (Signed)
 Pt seen and examined. Had some nausea last night. Improved with IV phenergan . Pt does not have prolonged Qtc on EKG. I disagree with computer interpretation. No abd pain. Less diarrhea. Pt states she is hungry and wants to eat solid food. Is tired of clear liquids. Wants to go home this evening if possible.

## 2023-10-28 NOTE — Progress Notes (Signed)
 PROGRESS NOTE    Jennifer Evans  FMW:[id number] DOB: 1961-03-17 DOA: 10/26/2023 PCP: Marvene Prentice SAUNDERS, FNP  Subjective: Pt seen and examined. Had some nausea last night. Improved with IV phenergan . Pt does not have prolonged Qtc on EKG. I disagree with computer interpretation. No abd pain. Less diarrhea. Pt states she is hungry and wants to eat solid food. Is tired of clear liquids. Wants to go home this evening if possible.   Hospital Course: HPI: 63 year old female history of hypertension, intermittent asthma, history of surgical hypothyroidism presents to the ER today with 2 days of crampy abdominal pain and loose stools.  She states that she went out to dinner on Thursday.  By Thursday evening, she started having some abdominal uneasiness.  She had a loose stool that evening.  She developed fever in the last 24 hours of 103.  She started having chills.  Occasional nausea but no vomiting.  She has never had a bout of diverticulitis before.  She came to the ER for evaluation.  No chest pain.  No shortness of breath.  Denies any dysuria.  Pains mostly in the left lower quadrant and suprapubic area.  Has been having loose stools since Friday night.  On arrival to the ER, temp 101.3 heart rate 106 blood pressure 140/74 satting 97% on room air.  White count 10.9, hemoglobin 13.3, platelets of 371  Lipase 24  Sodium 135, potassium 4.2, chloride 100, bicarb 23, BUN 13, creatinine 0.78, glucose 113  Total protein 6.9, BUN 4.2, AST 45, ALT 236, alk phos 139, total bili 0.7  Lactic acid normal at 0.6  UA showed trace hemoglobin.  Negative nitrite.  CT abdomen pelvis showed focal thickening and fluid inflammation around the rectosigmoid colon.  Suspected to be acute diverticulitis.  Significant Events: Admitted 10/26/2023 for acute sigmoid diverticulitis   Admission Labs: White count 10.9, hemoglobin 13.3, platelets of 371 Lipase 24 Sodium 135, potassium 4.2, chloride 100, bicarb 23, BUN  13, creatinine 0.78, glucose 113 Total protein 6.9, BUN 4.2, AST 45, ALT 236, alk phos 139, total bili 0.7 Lactic acid normal at 0.6 UA showed trace hemoglobin.  Negative nitrite.  Admission Imaging Studies: CT abdomen pelvis showed focal thickening and fluid inflammation around the rectosigmoid colon.  Suspected to be acute diverticulitis.  Significant Labs:   Significant Imaging Studies:   Antibiotic Therapy: Anti-infectives (From admission, onward)    Start     Dose/Rate Route Frequency Ordered Stop   10/27/23 1245  ciprofloxacin  (CIPRO ) tablet 500 mg        500 mg Oral 2 times daily 10/27/23 1148     10/27/23 1245  metroNIDAZOLE  (FLAGYL ) tablet 500 mg        500 mg Oral Every 12 hours 10/27/23 1148     10/27/23 0600  piperacillin -tazobactam (ZOSYN ) IVPB 3.375 g  Status:  Discontinued       Placed in Followed by Linked Group   3.375 g 12.5 mL/hr over 240 Minutes Intravenous Every 8 hours 10/26/23 1959 10/27/23 1148   10/26/23 2000  piperacillin -tazobactam (ZOSYN ) IVPB 3.375 g       Placed in Followed by Linked Group   3.375 g 100 mL/hr over 30 Minutes Intravenous  Once 10/26/23 1959 10/26/23 2138       Procedures:   Consultants: surgery    Assessment and Plan: * Sigmoid diverticulitis 10-27-2023 admit to med/surg bed. Pt without peritoneal signs. No vomiting. She is able to take po meds. Still start po cipro /flagyl . Allow  for clear liquids. Prn IV dilaudid  and prn po ultram . Pt wants to make sure she can tolerate liquids without nausea. Start scheduled IV zofran . She is already able to drink sweet tea without difficulty.  10-28-2023 tolerating po cipro /flagyl . I do not think she has a perforation. Pt has minimal abd pain. No leukocytosis. No fevers. Pt wants to try solid food. Will advance to soft GI diet. Prn zofran . If she is able to tolerate 2 solid meals, she can be discharged to home and f/u with PCP next week to make sure her diverticulitis has  resolved.  Status post total thyroidectomy 10-27-2023 continue with synthroid .  10-28-2023 stable.  Mild intermittent asthma 10-27-2023 continue with singulair . Prn albuterol .  10-28-2023 stable.   DVT prophylaxis: enoxaparin  (LOVENOX ) injection 40 mg Start: 10/27/23 1330 SCDs Start: 10/27/23 1240    Code Status: Full Code Family Communication: no family at bedside Disposition Plan: return home Reason for continuing need for hospitalization: may be able to go home later today.  Objective: Vitals:   10/27/23 2021 10/27/23 2340 10/28/23 0318 10/28/23 0821  BP: 138/73 (!) 155/76 (!) 161/84 132/64  Pulse: 71 77 77 73  Resp: 16 16 16 16   Temp: (!) 97.5 F (36.4 C) 97.8 F (36.6 C) 97.6 F (36.4 C) 97.7 F (36.5 C)  TempSrc:  Oral Oral   SpO2: 97% 98% 99% 95%  Weight:      Height:        Intake/Output Summary (Last 24 hours) at 10/28/2023 1132 Last data filed at 10/28/2023 0343 Gross per 24 hour  Intake 797.16 ml  Output --  Net 797.16 ml   Filed Weights   10/26/23 1822  Weight: 59.9 kg    Examination:  Physical Exam Vitals and nursing note reviewed.  Constitutional:      General: She is not in acute distress.    Appearance: She is normal weight. She is not toxic-appearing or diaphoretic.  HENT:     Head: Normocephalic and atraumatic.     Nose: Nose normal.  Eyes:     General: No scleral icterus. Cardiovascular:     Rate and Rhythm: Normal rate and regular rhythm.  Pulmonary:     Effort: Pulmonary effort is normal.     Breath sounds: Normal breath sounds.  Abdominal:     General: Abdomen is flat. Bowel sounds are normal. There is no distension.     Palpations: Abdomen is soft.     Tenderness: There is no abdominal tenderness. There is no guarding or rebound.  Musculoskeletal:     Right lower leg: No edema.     Left lower leg: No edema.  Skin:    General: Skin is warm and dry.     Capillary Refill: Capillary refill takes less than 2 seconds.   Neurological:     General: No focal deficit present.     Mental Status: She is alert and oriented to person, place, and time.     Data Reviewed: I have personally reviewed following labs and imaging studies  CBC: Recent Labs  Lab 10/26/23 1825 10/28/23 0350  WBC 10.9* 5.2  HGB 13.3 11.8*  HCT 40.2 36.2  MCV 92.0 93.1  PLT 371 317   Basic Metabolic Panel: Recent Labs  Lab 10/26/23 1825 10/28/23 0350  NA 135 135  K 4.2 4.0  CL 100 104  CO2 23 22  GLUCOSE 113* 120*  BUN 13 9  CREATININE 0.78 0.72  CALCIUM  9.5 8.5*  MG  --  1.9   GFR: Estimated Creatinine Clearance: 59.5 mL/min (by C-G formula based on SCr of 0.72 mg/dL). Liver Function Tests: Recent Labs  Lab 10/26/23 1825  AST 45*  ALT 36  ALKPHOS 139*  BILITOT 0.7  PROT 6.9  ALBUMIN 4.2   Recent Labs  Lab 10/26/23 1825  LIPASE 24   Sepsis Labs: Recent Labs  Lab 10/26/23 1953  LATICACIDVEN 0.6    Recent Results (from the past 240 hours)  Blood culture (routine x 2)     Status: None (Preliminary result)   Collection Time: 10/26/23  7:43 PM   Specimen: BLOOD  Result Value Ref Range Status   Specimen Description   Final    BLOOD LEFT ANTECUBITAL Performed at [facility], [address] [place], [place] [address]    Special Requests   Final    BOTTLES DRAWN AEROBIC AND ANAEROBIC Blood Culture adequate volume Performed at [facility], [address], [place], [place] [address]    Culture   Final    NO GROWTH 2 DAYS Performed at [facility], [address] [place], [place] [address]    Report Status PENDING  Incomplete  Blood culture (routine x 2)     Status: None (Preliminary result)   Collection Time: 10/26/23  8:10 PM   Specimen: BLOOD  Result Value Ref Range Status   Specimen Description   Final    BLOOD RIGHT ANTECUBITAL Performed at [facility], [address] [place], [place] [address]    Special Requests   Final    BOTTLES DRAWN  AEROBIC AND ANAEROBIC Blood Culture adequate volume Performed at [facility], [address] [place], [place] [address]    Culture   Final    NO GROWTH 2 DAYS Performed at [facility], [address], [place], [place] [address]    Report Status PENDING  Incomplete     Radiology Studies: CT ABDOMEN PELVIS W CONTRAST Result Date: 10/26/2023 CLINICAL DATA:  Loose stools lower pain EXAM: CT ABDOMEN AND PELVIS WITH CONTRAST TECHNIQUE: Multidetector CT imaging of the abdomen and pelvis was performed using the standard protocol following bolus administration of intravenous contrast. RADIATION DOSE REDUCTION: This exam was performed according to the departmental dose-optimization program which includes automated exposure control, adjustment of the mA and/or kV according to patient size and/or use of iterative reconstruction technique. CONTRAST:  OMNIPAQUE  IOHEXOL  300 MG/ML  SOLN COMPARISON:  CT 04/28/2010 FINDINGS: Lower chest: Lung bases demonstrate no acute airspace disease. Hepatobiliary: No focal liver abnormality is seen. No gallstones, gallbladder wall thickening, or biliary dilatation. Pancreas: Unremarkable. No pancreatic ductal dilatation or surrounding inflammatory changes. Spleen: Normal in size without focal abnormality. Adrenals/Urinary Tract: Adrenal glands are unremarkable. Kidneys are normal, without renal calculi, focal lesion, or hydronephrosis. Bladder is unremarkable. Stomach/Bowel: Stomach nonenlarged. No dilated small bowel. Fluid within the colon, likely corresponds to history of loose stools. Diverticular disease of the sigmoid colon. Focal wall thickening and inflammatory change at the rectosigmoid colon. Dot of gas within the posterior pelvis series 4, image 50, appears to correspond to a small diverticulum on sagittal views, series 9 image 98 but focal contained perforation not entirely excluded. Vascular/Lymphatic: Aortic atherosclerosis. No enlarged  abdominal or pelvic lymph nodes. Reproductive: Uterus unremarkable.  No adnexal mass Other: Negative for pelvic effusion or free air Musculoskeletal: Trace retrolisthesis L1 on L2 with degenerative changes. No acute osseous abnormality IMPRESSION: 1.  Focal wall thickening and inflammation involving the rectosigmoid colon suspected to be secondary to acute diverticulitis or possible colitis. Single dot of gas within the posterior pelvic fat, appears to represent small diverticulum on sagittal views but cannot entirely exclude small focus of contained perforation. No evidence for free air or abscess. Colonoscopy follow-up after resolution of acute symptoms is recommended to exclude other causes for focal colon inflammation. 2. Fluid within the colon, likely corresponds to history of loose stools. 3. Aortic atherosclerosis. Aortic Atherosclerosis (ICD10-I70.0). Electronically Signed   By: Luke Bun M.D.   On: 10/26/2023 21:31    Scheduled Meds:  ciprofloxacin   500 mg Oral BID   enoxaparin  (LOVENOX ) injection  40 mg Subcutaneous Q24H   levothyroxine   75 mcg Oral q morning   loratadine   10 mg Oral Daily   losartan   25 mg Oral Daily   metroNIDAZOLE   500 mg Oral Q12H   montelukast   10 mg Oral Daily   pravastatin   10 mg Oral Daily   simethicone   80 mg Oral QID   valACYclovir   1,000 mg Oral Daily   Continuous Infusions:   LOS: 1 day   Time spent: 55 minutes  Camellia Door, DO  [facility]  10/28/2023, 11:32 AM

## 2023-10-28 NOTE — Plan of Care (Signed)

## 2023-10-28 NOTE — Progress Notes (Signed)
 Progress Note     Interval: Patient reports nausea last night she thinks is related to having no food for an extended period of time as well as taking p.o. antibiotics on an empty stomach. White count has normalized, afebrile  Objective: Vital signs in last 24 hours: Temp:  [97.5 F (36.4 C)-98.6 F (37 C)] 98.6 F (37 C) (07/20 1155) Pulse Rate:  [65-85] 85 (07/20 1155) Resp:  [16] 16 (07/20 1155) BP: (128-161)/(64-84) 128/79 (07/20 1155) SpO2:  [95 %-99 %] 98 % (07/20 1155) Last BM Date : 10/27/23  Intake/Output from previous day: 07/19 0701 - 07/20 0700 In: 819.1 [P.O.:700; I.V.:0.1; IV Piggyback:119] Out: -  Intake/Output this shift: No intake/output data recorded.  PE: General: No acute distress, eating breakfast CV: Regular rate and rhythm Pulm: Normal work of breathing on room air Abdomen: Soft, nondistended, mild tenderness to palpation in central abdomen   Lab Results:  Recent Labs    10/26/23 1825 10/28/23 0350  WBC 10.9* 5.2  HGB 13.3 11.8*  HCT 40.2 36.2  PLT 371 317   BMET Recent Labs    10/26/23 1825 10/28/23 0350  NA 135 135  K 4.2 4.0  CL 100 104  CO2 23 22  GLUCOSE 113* 120*  BUN 13 9  CREATININE 0.78 0.72  CALCIUM  9.5 8.5*   PT/INR No results for input(s): LABPROT, INR in the last 72 hours. CMP     Component Value Date/Time   NA 135 10/28/2023 0350   K 4.0 10/28/2023 0350   CL 104 10/28/2023 0350   CO2 22 10/28/2023 0350   GLUCOSE 120 (H) 10/28/2023 0350   BUN 9 10/28/2023 0350   CREATININE 0.72 10/28/2023 0350   CALCIUM  8.5 (L) 10/28/2023 0350   PROT 6.9 10/26/2023 1825   ALBUMIN 4.2 10/26/2023 1825   AST 45 (H) 10/26/2023 1825   ALT 36 10/26/2023 1825   ALKPHOS 139 (H) 10/26/2023 1825   BILITOT 0.7 10/26/2023 1825   GFRNONAA >60 10/28/2023 0350   GFRAA  04/28/2010 1119    >60        The eGFR has been calculated using the MDRD equation. This calculation has not been validated in all  clinical situations. eGFR's persistently <60 mL/min signify possible Chronic Kidney Disease.   Lipase     Component Value Date/Time   LIPASE 24 10/26/2023 1825       Studies/Results: CT ABDOMEN PELVIS W CONTRAST Result Date: 10/26/2023 CLINICAL DATA:  Loose stools lower pain EXAM: CT ABDOMEN AND PELVIS WITH CONTRAST TECHNIQUE: Multidetector CT imaging of the abdomen and pelvis was performed using the standard protocol following bolus administration of intravenous contrast. RADIATION DOSE REDUCTION: This exam was performed according to the departmental dose-optimization program which includes automated exposure control, adjustment of the mA and/or kV according to patient size and/or use of iterative reconstruction technique. CONTRAST:  OMNIPAQUE  IOHEXOL  300 MG/ML  SOLN COMPARISON:  CT 04/28/2010 FINDINGS: Lower chest: Lung bases demonstrate no acute airspace disease. Hepatobiliary: No focal liver abnormality is seen. No gallstones, gallbladder wall thickening, or biliary dilatation. Pancreas: Unremarkable. No pancreatic ductal dilatation or surrounding inflammatory changes. Spleen: Normal in size without focal abnormality. Adrenals/Urinary Tract: Adrenal glands are unremarkable. Kidneys are normal, without renal calculi, focal lesion, or hydronephrosis. Bladder is unremarkable. Stomach/Bowel: Stomach nonenlarged. No dilated small bowel. Fluid within the colon, likely corresponds to history of loose stools. Diverticular disease of the sigmoid colon. Focal wall thickening and inflammatory change at the rectosigmoid colon. Dot of  gas within the posterior pelvis series 4, image 50, appears to correspond to a small diverticulum on sagittal views, series 9 image 98 but focal contained perforation not entirely excluded. Vascular/Lymphatic: Aortic atherosclerosis. No enlarged abdominal or pelvic lymph nodes. Reproductive: Uterus unremarkable.  No adnexal mass Other: Negative for pelvic effusion or free  air Musculoskeletal: Trace retrolisthesis L1 on L2 with degenerative changes. No acute osseous abnormality IMPRESSION: 1. Focal wall thickening and inflammation involving the rectosigmoid colon suspected to be secondary to acute diverticulitis or possible colitis. Single dot of gas within the posterior pelvic fat, appears to represent small diverticulum on sagittal views but cannot entirely exclude small focus of contained perforation. No evidence for free air or abscess. Colonoscopy follow-up after resolution of acute symptoms is recommended to exclude other causes for focal colon inflammation. 2. Fluid within the colon, likely corresponds to history of loose stools. 3. Aortic atherosclerosis. Aortic Atherosclerosis (ICD10-I70.0). Electronically Signed   By: Luke Bun M.D.   On: 10/26/2023 21:31     Assessment/Plan 63 year old female with rectosigmoid diverticulitis versus colitis and questionable microperforation.  LOS: 1 day   Patient already tolerating a soft diet this morning when evaluated.  - I recommend patient stays at least through lunch to make sure abdominal pain does not worsen but continues to improve.  If patient has no nausea and no worsening abdominal pain, cleared for discharge from surgical perspective. -No need for surgery follow-up outpatient at this time, but patient will need follow-up with GI for a colonoscopy in 6 to 8 weeks after resolution of diverticulitis   I reviewed hospitalist notes, last 24 h vitals and pain scores, last 48 h intake and output, last 24 h labs and trends, and last 24 h imaging results.  This care required moderate level of medical decision making.    Richerd Silversmith, MD [facility] Surgery 10/28/2023, 1:03 PM Please see Amion for pager number during day hours 7:00am-4:30pm

## 2023-10-28 NOTE — Plan of Care (Signed)

## 2023-10-31 DIAGNOSIS — D473 Essential (hemorrhagic) thrombocythemia: Secondary | ICD-10-CM | POA: Diagnosis not present

## 2023-10-31 DIAGNOSIS — I10 Essential (primary) hypertension: Secondary | ICD-10-CM | POA: Diagnosis not present

## 2023-10-31 DIAGNOSIS — K5792 Diverticulitis of intestine, part unspecified, without perforation or abscess without bleeding: Secondary | ICD-10-CM | POA: Diagnosis not present

## 2023-10-31 DIAGNOSIS — E782 Mixed hyperlipidemia: Secondary | ICD-10-CM | POA: Diagnosis not present

## 2023-10-31 DIAGNOSIS — R7309 Other abnormal glucose: Secondary | ICD-10-CM | POA: Diagnosis not present

## 2023-10-31 DIAGNOSIS — Z09 Encounter for follow-up examination after completed treatment for conditions other than malignant neoplasm: Secondary | ICD-10-CM | POA: Diagnosis not present

## 2023-10-31 LAB — CULTURE, BLOOD (ROUTINE X 2)
Culture: NO GROWTH
Culture: NO GROWTH
Special Requests: ADEQUATE
Special Requests: ADEQUATE

## 2023-11-01 ENCOUNTER — Other Ambulatory Visit (HOSPITAL_BASED_OUTPATIENT_CLINIC_OR_DEPARTMENT_OTHER): Payer: Self-pay | Admitting: Family Medicine

## 2023-11-01 DIAGNOSIS — E782 Mixed hyperlipidemia: Secondary | ICD-10-CM

## 2023-11-01 DIAGNOSIS — I10 Essential (primary) hypertension: Secondary | ICD-10-CM

## 2023-11-01 DIAGNOSIS — I700 Atherosclerosis of aorta: Secondary | ICD-10-CM

## 2023-11-06 ENCOUNTER — Ambulatory Visit (HOSPITAL_BASED_OUTPATIENT_CLINIC_OR_DEPARTMENT_OTHER)
Admission: RE | Admit: 2023-11-06 | Discharge: 2023-11-06 | Disposition: A | Discharge status: Home / Self Care | Payer: Self-pay | Source: Ambulatory Visit | Attending: Family Medicine | Admitting: Family Medicine

## 2023-11-06 DIAGNOSIS — E782 Mixed hyperlipidemia: Secondary | ICD-10-CM | POA: Insufficient documentation

## 2023-11-06 DIAGNOSIS — I10 Essential (primary) hypertension: Secondary | ICD-10-CM | POA: Insufficient documentation

## 2023-11-06 DIAGNOSIS — I700 Atherosclerosis of aorta: Secondary | ICD-10-CM | POA: Insufficient documentation

## 2023-11-19 DIAGNOSIS — M9903 Segmental and somatic dysfunction of lumbar region: Secondary | ICD-10-CM | POA: Diagnosis not present

## 2023-11-19 DIAGNOSIS — M9902 Segmental and somatic dysfunction of thoracic region: Secondary | ICD-10-CM | POA: Diagnosis not present

## 2023-11-19 DIAGNOSIS — M9906 Segmental and somatic dysfunction of lower extremity: Secondary | ICD-10-CM | POA: Diagnosis not present

## 2023-11-19 DIAGNOSIS — M9901 Segmental and somatic dysfunction of cervical region: Secondary | ICD-10-CM | POA: Diagnosis not present

## 2023-11-28 DIAGNOSIS — M9902 Segmental and somatic dysfunction of thoracic region: Secondary | ICD-10-CM | POA: Diagnosis not present

## 2023-11-28 DIAGNOSIS — M9906 Segmental and somatic dysfunction of lower extremity: Secondary | ICD-10-CM | POA: Diagnosis not present

## 2023-11-28 DIAGNOSIS — M9903 Segmental and somatic dysfunction of lumbar region: Secondary | ICD-10-CM | POA: Diagnosis not present

## 2023-11-28 DIAGNOSIS — M9901 Segmental and somatic dysfunction of cervical region: Secondary | ICD-10-CM | POA: Diagnosis not present

## 2023-12-05 DIAGNOSIS — J4540 Moderate persistent asthma, uncomplicated: Secondary | ICD-10-CM | POA: Diagnosis not present

## 2023-12-06 DIAGNOSIS — R0989 Other specified symptoms and signs involving the circulatory and respiratory systems: Secondary | ICD-10-CM | POA: Diagnosis not present

## 2023-12-12 DIAGNOSIS — K5792 Diverticulitis of intestine, part unspecified, without perforation or abscess without bleeding: Secondary | ICD-10-CM | POA: Diagnosis not present

## 2023-12-27 ENCOUNTER — Other Ambulatory Visit

## 2023-12-27 ENCOUNTER — Encounter: Admitting: Hematology and Oncology

## 2024-01-08 DIAGNOSIS — K648 Other hemorrhoids: Secondary | ICD-10-CM | POA: Diagnosis not present

## 2024-01-08 DIAGNOSIS — K5732 Diverticulitis of large intestine without perforation or abscess without bleeding: Secondary | ICD-10-CM | POA: Diagnosis not present

## 2024-01-08 DIAGNOSIS — K5730 Diverticulosis of large intestine without perforation or abscess without bleeding: Secondary | ICD-10-CM | POA: Diagnosis not present

## 2024-01-21 ENCOUNTER — Other Ambulatory Visit: Payer: Self-pay | Admitting: Obstetrics and Gynecology

## 2024-01-21 DIAGNOSIS — Z1231 Encounter for screening mammogram for malignant neoplasm of breast: Secondary | ICD-10-CM

## 2024-01-23 DIAGNOSIS — M9903 Segmental and somatic dysfunction of lumbar region: Secondary | ICD-10-CM | POA: Diagnosis not present

## 2024-01-23 DIAGNOSIS — M9902 Segmental and somatic dysfunction of thoracic region: Secondary | ICD-10-CM | POA: Diagnosis not present

## 2024-01-23 DIAGNOSIS — M9901 Segmental and somatic dysfunction of cervical region: Secondary | ICD-10-CM | POA: Diagnosis not present

## 2024-01-23 DIAGNOSIS — M9906 Segmental and somatic dysfunction of lower extremity: Secondary | ICD-10-CM | POA: Diagnosis not present

## 2024-02-05 DIAGNOSIS — M9903 Segmental and somatic dysfunction of lumbar region: Secondary | ICD-10-CM | POA: Diagnosis not present

## 2024-02-05 DIAGNOSIS — M9906 Segmental and somatic dysfunction of lower extremity: Secondary | ICD-10-CM | POA: Diagnosis not present

## 2024-02-05 DIAGNOSIS — M9901 Segmental and somatic dysfunction of cervical region: Secondary | ICD-10-CM | POA: Diagnosis not present

## 2024-02-05 DIAGNOSIS — M9902 Segmental and somatic dysfunction of thoracic region: Secondary | ICD-10-CM | POA: Diagnosis not present

## 2024-02-14 ENCOUNTER — Encounter: Admitting: Hematology and Oncology

## 2024-02-14 ENCOUNTER — Other Ambulatory Visit

## 2024-02-21 ENCOUNTER — Ambulatory Visit
Admission: RE | Admit: 2024-02-21 | Discharge: 2024-02-21 | Disposition: A | Discharge status: Home / Self Care | Source: Ambulatory Visit | Attending: Obstetrics and Gynecology | Admitting: Obstetrics and Gynecology

## 2024-02-21 DIAGNOSIS — Z1231 Encounter for screening mammogram for malignant neoplasm of breast: Secondary | ICD-10-CM

## 2024-02-24 DIAGNOSIS — J019 Acute sinusitis, unspecified: Secondary | ICD-10-CM | POA: Diagnosis not present

## 2024-02-25 DIAGNOSIS — M9906 Segmental and somatic dysfunction of lower extremity: Secondary | ICD-10-CM | POA: Diagnosis not present

## 2024-02-25 DIAGNOSIS — M9902 Segmental and somatic dysfunction of thoracic region: Secondary | ICD-10-CM | POA: Diagnosis not present

## 2024-02-25 DIAGNOSIS — M9901 Segmental and somatic dysfunction of cervical region: Secondary | ICD-10-CM | POA: Diagnosis not present

## 2024-02-25 DIAGNOSIS — M9903 Segmental and somatic dysfunction of lumbar region: Secondary | ICD-10-CM | POA: Diagnosis not present

## 2024-02-28 DIAGNOSIS — Z01419 Encounter for gynecological examination (general) (routine) without abnormal findings: Secondary | ICD-10-CM | POA: Diagnosis not present

## 2024-02-28 DIAGNOSIS — Z1151 Encounter for screening for human papillomavirus (HPV): Secondary | ICD-10-CM | POA: Diagnosis not present

## 2024-02-28 DIAGNOSIS — Z124 Encounter for screening for malignant neoplasm of cervix: Secondary | ICD-10-CM | POA: Diagnosis not present

## 2024-02-28 DIAGNOSIS — Z6824 Body mass index (BMI) 24.0-24.9, adult: Secondary | ICD-10-CM | POA: Diagnosis not present
